# Patient Record
Sex: Male | Born: 1980 | Race: Black or African American | Hispanic: No | Marital: Single | Smoking: Current every day smoker
Health system: Southern US, Community
[De-identification: ages and names within clinical notes are randomized; demographics above are authoritative.]

## PROBLEM LIST (undated history)

## (undated) DIAGNOSIS — F209 Schizophrenia, unspecified: Secondary | ICD-10-CM

## (undated) DIAGNOSIS — F32A Depression, unspecified: Secondary | ICD-10-CM

## (undated) DIAGNOSIS — F329 Major depressive disorder, single episode, unspecified: Secondary | ICD-10-CM

## (undated) DIAGNOSIS — F319 Bipolar disorder, unspecified: Secondary | ICD-10-CM

## (undated) DIAGNOSIS — I10 Essential (primary) hypertension: Secondary | ICD-10-CM

---
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
## 2006-04-30 ENCOUNTER — Emergency Department (HOSPITAL_COMMUNITY): Admission: EM | Admit: 2006-04-30 | Discharge: 2006-05-01 | Payer: Self-pay | Admitting: Emergency Medicine

## 2006-05-06 ENCOUNTER — Emergency Department (HOSPITAL_COMMUNITY): Admission: EM | Admit: 2006-05-06 | Discharge: 2006-05-06 | Payer: Self-pay | Admitting: *Deleted

## 2006-07-26 ENCOUNTER — Emergency Department (HOSPITAL_COMMUNITY): Admission: EM | Admit: 2006-07-26 | Discharge: 2006-07-26 | Payer: Self-pay | Admitting: Emergency Medicine

## 2006-07-29 ENCOUNTER — Emergency Department (HOSPITAL_COMMUNITY): Admission: EM | Admit: 2006-07-29 | Discharge: 2006-07-29 | Payer: Self-pay | Admitting: Emergency Medicine

## 2006-12-12 ENCOUNTER — Emergency Department (HOSPITAL_COMMUNITY): Admission: EM | Admit: 2006-12-12 | Discharge: 2006-12-12 | Payer: Self-pay | Admitting: Emergency Medicine

## 2009-12-02 ENCOUNTER — Emergency Department (HOSPITAL_COMMUNITY): Admission: EM | Admit: 2009-12-02 | Discharge: 2009-12-02 | Payer: Self-pay | Admitting: Emergency Medicine
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
## 2010-11-23 LAB — GC/CHLAMYDIA PROBE AMP, GENITAL
Chlamydia, DNA Probe: NEGATIVE
GC Probe Amp, Genital: NEGATIVE

## 2010-12-01 LAB — URINE CULTURE
Colony Count: NO GROWTH
Culture: NO GROWTH

## 2010-12-01 LAB — URINE MICROSCOPIC-ADD ON

## 2010-12-01 LAB — URINALYSIS, ROUTINE W REFLEX MICROSCOPIC
Bilirubin Urine: NEGATIVE
Glucose, UA: NEGATIVE
Ketones, ur: 15 — AB
pH: 6.5

## 2016-06-04 ENCOUNTER — Emergency Department (HOSPITAL_COMMUNITY)
Admission: EM | Admit: 2016-06-04 | Discharge: 2016-06-04 | Disposition: A | Discharge status: Home / Self Care | Payer: Self-pay | Attending: Emergency Medicine | Admitting: Emergency Medicine

## 2016-06-04 ENCOUNTER — Encounter (HOSPITAL_COMMUNITY): Payer: Self-pay | Admitting: Emergency Medicine

## 2016-06-04 DIAGNOSIS — Z113 Encounter for screening for infections with a predominantly sexual mode of transmission: Secondary | ICD-10-CM | POA: Insufficient documentation

## 2016-06-04 DIAGNOSIS — Z711 Person with feared health complaint in whom no diagnosis is made: Secondary | ICD-10-CM

## 2016-06-04 DIAGNOSIS — F17200 Nicotine dependence, unspecified, uncomplicated: Secondary | ICD-10-CM | POA: Insufficient documentation

## 2016-06-04 DIAGNOSIS — G8921 Chronic pain due to trauma: Secondary | ICD-10-CM | POA: Insufficient documentation

## 2016-06-04 DIAGNOSIS — L03012 Cellulitis of left finger: Secondary | ICD-10-CM | POA: Insufficient documentation

## 2016-06-04 MED ORDER — CEPHALEXIN 250 MG PO CAPS
500.0000 mg | ORAL_CAPSULE | Freq: Once | ORAL | Status: AC
  Administered 2016-06-04: 500 mg via ORAL
  Filled 2016-06-04: qty 2

## 2016-06-04 MED ORDER — LIDOCAINE HCL (PF) 1 % IJ SOLN
5.0000 mL | Freq: Once | INTRAMUSCULAR | Status: AC
  Administered 2016-06-04: 5 mL
  Filled 2016-06-04: qty 5

## 2016-06-04 MED ORDER — CEPHALEXIN 500 MG PO CAPS: 500.0000 mg | ORAL_CAPSULE | Freq: Four times a day (QID) | ORAL | 0 refills | Status: DC

## 2016-06-04 MED ORDER — HYDROCODONE-ACETAMINOPHEN 5-325 MG PO TABS
1.0000 | ORAL_TABLET | Freq: Once | ORAL | Status: AC
  Administered 2016-06-04: 1 via ORAL
  Filled 2016-06-04: qty 1

## 2016-06-04 MED ORDER — SULFAMETHOXAZOLE-TRIMETHOPRIM 800-160 MG PO TABS
1.0000 | ORAL_TABLET | Freq: Once | ORAL | Status: AC
  Administered 2016-06-04: 1 via ORAL
  Filled 2016-06-04: qty 1

## 2016-06-04 MED ORDER — SULFAMETHOXAZOLE-TRIMETHOPRIM 800-160 MG PO TABS: 1.0000 | ORAL_TABLET | Freq: Two times a day (BID) | ORAL | 0 refills | Status: AC

## 2016-06-04 MED ORDER — DICLOFENAC SODIUM 50 MG PO TBEC: 50.0000 mg | DELAYED_RELEASE_TABLET | Freq: Two times a day (BID) | ORAL | 0 refills | Status: DC

## 2016-06-04 NOTE — Discharge Instructions (Signed)
Call [facility] for follow up. Soak you finger in warm walt water 3 times a day for 15 minutes. Take the medications as directed.  We will call you if your cultures come back positive for your possible STD exposure.
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
## 2016-06-05 LAB — GC/CHLAMYDIA PROBE AMP (~~LOC~~) NOT AT ARMC
Chlamydia: NEGATIVE
Neisseria Gonorrhea: NEGATIVE

## 2016-06-11 ENCOUNTER — Emergency Department (HOSPITAL_COMMUNITY): Payer: Self-pay

## 2016-06-11 ENCOUNTER — Emergency Department (HOSPITAL_COMMUNITY): Payer: Self-pay | Admitting: Anesthesiology

## 2016-06-11 ENCOUNTER — Encounter (HOSPITAL_COMMUNITY): Payer: Self-pay | Admitting: Emergency Medicine

## 2016-06-11 ENCOUNTER — Ambulatory Visit (HOSPITAL_COMMUNITY)
Admission: EM | Admit: 2016-06-11 | Discharge: 2016-06-12 | Disposition: A | Discharge status: Home / Self Care | Payer: Self-pay | Attending: Orthopedic Surgery | Admitting: Orthopedic Surgery

## 2016-06-11 ENCOUNTER — Encounter (HOSPITAL_COMMUNITY)
Admission: EM | Disposition: A | Discharge status: Home / Self Care | Payer: Self-pay | Source: Home / Self Care | Attending: Emergency Medicine

## 2016-06-11 DIAGNOSIS — F1410 Cocaine abuse, uncomplicated: Secondary | ICD-10-CM | POA: Insufficient documentation

## 2016-06-11 DIAGNOSIS — F1210 Cannabis abuse, uncomplicated: Secondary | ICD-10-CM | POA: Insufficient documentation

## 2016-06-11 DIAGNOSIS — T148XXA Other injury of unspecified body region, initial encounter: Secondary | ICD-10-CM

## 2016-06-11 DIAGNOSIS — S66822A Laceration of other specified muscles, fascia and tendons at wrist and hand level, left hand, initial encounter: Secondary | ICD-10-CM | POA: Insufficient documentation

## 2016-06-11 DIAGNOSIS — S66323A Laceration of extensor muscle, fascia and tendon of left middle finger at wrist and hand level, initial encounter: Secondary | ICD-10-CM | POA: Insufficient documentation

## 2016-06-11 DIAGNOSIS — Y929 Unspecified place or not applicable: Secondary | ICD-10-CM | POA: Insufficient documentation

## 2016-06-11 DIAGNOSIS — S66327A Laceration of extensor muscle, fascia and tendon of left little finger at wrist and hand level, initial encounter: Secondary | ICD-10-CM | POA: Insufficient documentation

## 2016-06-11 DIAGNOSIS — M542 Cervicalgia: Secondary | ICD-10-CM

## 2016-06-11 DIAGNOSIS — S0990XA Unspecified injury of head, initial encounter: Secondary | ICD-10-CM

## 2016-06-11 DIAGNOSIS — S52502B Unspecified fracture of the lower end of left radius, initial encounter for open fracture type I or II: Secondary | ICD-10-CM | POA: Diagnosis present

## 2016-06-11 DIAGNOSIS — S52572B Other intraarticular fracture of lower end of left radius, initial encounter for open fracture type I or II: Secondary | ICD-10-CM | POA: Insufficient documentation

## 2016-06-11 DIAGNOSIS — S66321A Laceration of extensor muscle, fascia and tendon of left index finger at wrist and hand level, initial encounter: Secondary | ICD-10-CM | POA: Insufficient documentation

## 2016-06-11 DIAGNOSIS — F17200 Nicotine dependence, unspecified, uncomplicated: Secondary | ICD-10-CM | POA: Insufficient documentation

## 2016-06-11 DIAGNOSIS — S66325A Laceration of extensor muscle, fascia and tendon of left ring finger at wrist and hand level, initial encounter: Secondary | ICD-10-CM | POA: Insufficient documentation

## 2016-06-11 DIAGNOSIS — Y939 Activity, unspecified: Secondary | ICD-10-CM | POA: Insufficient documentation

## 2016-06-11 DIAGNOSIS — T07XXXA Unspecified multiple injuries, initial encounter: Secondary | ICD-10-CM

## 2016-06-11 DIAGNOSIS — S0219XA Other fracture of base of skull, initial encounter for closed fracture: Secondary | ICD-10-CM

## 2016-06-11 HISTORY — PX: OPEN REDUCTION INTERNAL FIXATION (ORIF) DISTAL RADIAL FRACTURE: SHX5989

## 2016-06-11 LAB — I-STAT CHEM 8, ED
BUN: 16 mg/dL (ref 6–20)
CHLORIDE: 111 mmol/L (ref 101–111)
CREATININE: 1.5 mg/dL — AB (ref 0.61–1.24)
Calcium, Ion: 1 mmol/L — ABNORMAL LOW (ref 1.15–1.40)
GLUCOSE: 86 mg/dL (ref 65–99)
HCT: 43 % (ref 39.0–52.0)
Hemoglobin: 14.6 g/dL (ref 13.0–17.0)
POTASSIUM: 3.6 mmol/L (ref 3.5–5.1)
Sodium: 144 mmol/L (ref 135–145)
TCO2: 22 mmol/L (ref 0–100)

## 2016-06-11 LAB — COMPREHENSIVE METABOLIC PANEL
ALK PHOS: 59 U/L (ref 38–126)
ALT: 19 U/L (ref 17–63)
AST: 43 U/L — AB (ref 15–41)
Albumin: 4.2 g/dL (ref 3.5–5.0)
Anion gap: 12 (ref 5–15)
BILIRUBIN TOTAL: 0.6 mg/dL (ref 0.3–1.2)
BUN: 11 mg/dL (ref 6–20)
CALCIUM: 9 mg/dL (ref 8.9–10.3)
CO2: 20 mmol/L — ABNORMAL LOW (ref 22–32)
CREATININE: 1.37 mg/dL — AB (ref 0.61–1.24)
Chloride: 111 mmol/L (ref 101–111)
Glucose, Bld: 86 mg/dL (ref 65–99)
Potassium: 3.4 mmol/L — ABNORMAL LOW (ref 3.5–5.1)
Sodium: 143 mmol/L (ref 135–145)
Total Protein: 7.9 g/dL (ref 6.5–8.1)

## 2016-06-11 LAB — URINALYSIS, ROUTINE W REFLEX MICROSCOPIC
Bilirubin Urine: NEGATIVE
GLUCOSE, UA: NEGATIVE mg/dL
Ketones, ur: NEGATIVE mg/dL
LEUKOCYTES UA: NEGATIVE
NITRITE: NEGATIVE
PROTEIN: 30 mg/dL — AB
SQUAMOUS EPITHELIAL / LPF: NONE SEEN
Specific Gravity, Urine: 1.024 (ref 1.005–1.030)
pH: 5 (ref 5.0–8.0)

## 2016-06-11 LAB — CBC
HCT: 40.7 % (ref 39.0–52.0)
Hemoglobin: 13 g/dL (ref 13.0–17.0)
MCH: 24 pg — AB (ref 26.0–34.0)
MCHC: 31.9 g/dL (ref 30.0–36.0)
MCV: 75.1 fL — ABNORMAL LOW (ref 78.0–100.0)
PLATELETS: 287 10*3/uL (ref 150–400)
RBC: 5.42 MIL/uL (ref 4.22–5.81)
RDW: 16.1 % — AB (ref 11.5–15.5)
WBC: 15.8 10*3/uL — AB (ref 4.0–10.5)

## 2016-06-11 LAB — I-STAT TROPONIN, ED: Troponin i, poc: 0.01 ng/mL (ref 0.00–0.08)

## 2016-06-11 LAB — RAPID URINE DRUG SCREEN, HOSP PERFORMED
AMPHETAMINES: NOT DETECTED
BENZODIAZEPINES: NOT DETECTED
Barbiturates: NOT DETECTED
Cocaine: POSITIVE — AB
Opiates: NOT DETECTED
Tetrahydrocannabinol: NOT DETECTED

## 2016-06-11 LAB — ETHANOL: ALCOHOL ETHYL (B): 60 mg/dL — AB (ref <5)

## 2016-06-11 SURGERY — OPEN REDUCTION INTERNAL FIXATION (ORIF) DISTAL RADIUS FRACTURE
Anesthesia: Monitor Anesthesia Care | Site: Arm Lower | Laterality: Left

## 2016-06-11 MED ORDER — OXYCODONE-ACETAMINOPHEN 5-325 MG PO TABS: ORAL_TABLET | ORAL | 0 refills | Status: DC

## 2016-06-11 MED ORDER — METHOCARBAMOL 500 MG PO TABS
500.0000 mg | ORAL_TABLET | Freq: Four times a day (QID) | ORAL | Status: DC | PRN
  Administered 2016-06-11 – 2016-06-12 (×3): 500 mg via ORAL
  Filled 2016-06-11 (×3): qty 1

## 2016-06-11 MED ORDER — MIDAZOLAM HCL 5 MG/5ML IJ SOLN
INTRAMUSCULAR | Status: DC | PRN
  Administered 2016-06-11: 2 mg via INTRAVENOUS

## 2016-06-11 MED ORDER — LACTATED RINGERS IV SOLN
INTRAVENOUS | Status: DC
  Administered 2016-06-11 – 2016-06-12 (×2): via INTRAVENOUS

## 2016-06-11 MED ORDER — PROPOFOL 1000 MG/100ML IV EMUL
INTRAVENOUS | Status: AC
  Filled 2016-06-11: qty 100

## 2016-06-11 MED ORDER — BUPIVACAINE-EPINEPHRINE (PF) 0.5% -1:200000 IJ SOLN
INTRAMUSCULAR | Status: DC | PRN
  Administered 2016-06-11: 30 mL via PERINEURAL

## 2016-06-11 MED ORDER — CEFAZOLIN SODIUM-DEXTROSE 1-4 GM/50ML-% IV SOLN
1.0000 g | Freq: Three times a day (TID) | INTRAVENOUS | Status: DC
  Filled 2016-06-11 (×2): qty 50

## 2016-06-11 MED ORDER — DEXMEDETOMIDINE HCL IN NACL 200 MCG/50ML IV SOLN
INTRAVENOUS | Status: AC
  Filled 2016-06-11: qty 50

## 2016-06-11 MED ORDER — ONDANSETRON HCL 4 MG PO TABS: 4.0000 mg | ORAL_TABLET | Freq: Four times a day (QID) | ORAL | Status: DC | PRN

## 2016-06-11 MED ORDER — CEFAZOLIN SODIUM-DEXTROSE 1-4 GM/50ML-% IV SOLN
1.0000 g | INTRAVENOUS | Status: DC
Start: 2016-06-11 — End: 2016-06-11
  Filled 2016-06-11: qty 50

## 2016-06-11 MED ORDER — ENOXAPARIN SODIUM 40 MG/0.4ML ~~LOC~~ SOLN
40.0000 mg | SUBCUTANEOUS | Status: DC
  Filled 2016-06-11: qty 0.4

## 2016-06-11 MED ORDER — FAMOTIDINE 20 MG PO TABS: 20.0000 mg | ORAL_TABLET | Freq: Two times a day (BID) | ORAL | Status: DC | PRN

## 2016-06-11 MED ORDER — TEMAZEPAM 15 MG PO CAPS
15.0000 mg | ORAL_CAPSULE | Freq: Every evening | ORAL | Status: DC | PRN
  Administered 2016-06-12: 15 mg via ORAL
  Filled 2016-06-11: qty 1

## 2016-06-11 MED ORDER — OXYCODONE HCL 5 MG/5ML PO SOLN: 5.0000 mg | Freq: Once | ORAL | Status: DC | PRN

## 2016-06-11 MED ORDER — OXYCODONE HCL 5 MG PO TABS: 5.0000 mg | ORAL_TABLET | Freq: Once | ORAL | Status: DC | PRN

## 2016-06-11 MED ORDER — MORPHINE SULFATE (PF) 4 MG/ML IV SOLN
1.0000 mg | INTRAVENOUS | Status: DC | PRN
  Administered 2016-06-12 (×2): 1 mg via INTRAVENOUS
  Filled 2016-06-11 (×2): qty 1

## 2016-06-11 MED ORDER — DIPHENHYDRAMINE HCL 25 MG PO CAPS: 25.0000 mg | ORAL_CAPSULE | Freq: Four times a day (QID) | ORAL | Status: DC | PRN

## 2016-06-11 MED ORDER — DEXMEDETOMIDINE HCL 200 MCG/2ML IV SOLN
INTRAVENOUS | Status: DC | PRN
  Administered 2016-06-11: 4 ug via INTRAVENOUS
  Administered 2016-06-11: 8 ug via INTRAVENOUS
  Administered 2016-06-11 (×2): 4 ug via INTRAVENOUS
  Administered 2016-06-11: 8 ug via INTRAVENOUS
  Administered 2016-06-11: 4 ug via INTRAVENOUS
  Administered 2016-06-11: 8 ug via INTRAVENOUS
  Administered 2016-06-11 (×2): 4 ug via INTRAVENOUS

## 2016-06-11 MED ORDER — METHOCARBAMOL 1000 MG/10ML IJ SOLN
500.0000 mg | Freq: Four times a day (QID) | INTRAVENOUS | Status: DC | PRN
  Filled 2016-06-11: qty 5

## 2016-06-11 MED ORDER — FENTANYL CITRATE (PF) 100 MCG/2ML IJ SOLN: 25.0000 ug | INTRAMUSCULAR | Status: DC | PRN

## 2016-06-11 MED ORDER — LIDOCAINE-EPINEPHRINE (PF) 1.5 %-1:200000 IJ SOLN
INTRAMUSCULAR | Status: DC | PRN
  Administered 2016-06-11: 10 mL via PERINEURAL

## 2016-06-11 MED ORDER — OXYCODONE-ACETAMINOPHEN 5-325 MG PO TABS
1.0000 | ORAL_TABLET | ORAL | Status: DC | PRN
  Administered 2016-06-11 – 2016-06-12 (×4): 2 via ORAL
  Filled 2016-06-11 (×4): qty 2

## 2016-06-11 MED ORDER — FENTANYL CITRATE (PF) 250 MCG/5ML IJ SOLN
INTRAMUSCULAR | Status: AC
  Filled 2016-06-11: qty 5

## 2016-06-11 MED ORDER — FENTANYL CITRATE (PF) 250 MCG/5ML IJ SOLN
INTRAMUSCULAR | Status: DC | PRN
  Administered 2016-06-11 (×2): 50 ug via INTRAVENOUS

## 2016-06-11 MED ORDER — LIDOCAINE HCL 1 % IJ SOLN
30.0000 mL | Freq: Once | INTRAMUSCULAR | Status: AC
  Administered 2016-06-11: 10 mL
  Filled 2016-06-11: qty 40

## 2016-06-11 MED ORDER — LACTATED RINGERS IV SOLN
INTRAVENOUS | Status: DC | PRN
  Administered 2016-06-11 (×3): via INTRAVENOUS

## 2016-06-11 MED ORDER — MIDAZOLAM HCL 2 MG/2ML IJ SOLN
INTRAMUSCULAR | Status: AC
  Filled 2016-06-11: qty 2

## 2016-06-11 MED ORDER — SULFAMETHOXAZOLE-TRIMETHOPRIM 800-160 MG PO TABS: 1.0000 | ORAL_TABLET | Freq: Two times a day (BID) | ORAL | 0 refills | Status: DC

## 2016-06-11 MED ORDER — PROPOFOL 500 MG/50ML IV EMUL
INTRAVENOUS | Status: DC | PRN
  Administered 2016-06-11: 75 ug/kg/min via INTRAVENOUS

## 2016-06-11 MED ORDER — ONDANSETRON HCL 4 MG/2ML IJ SOLN: 4.0000 mg | Freq: Four times a day (QID) | INTRAMUSCULAR | Status: DC | PRN

## 2016-06-11 MED ORDER — PROPOFOL 10 MG/ML IV BOLUS
INTRAVENOUS | Status: AC
  Filled 2016-06-11: qty 20

## 2016-06-11 MED ORDER — BUPIVACAINE HCL (PF) 0.25 % IJ SOLN
INTRAMUSCULAR | Status: AC
  Filled 2016-06-11: qty 30

## 2016-06-11 MED ORDER — VITAMIN C 500 MG PO TABS
1000.0000 mg | ORAL_TABLET | Freq: Every day | ORAL | Status: DC
  Administered 2016-06-11 – 2016-06-12 (×2): 1000 mg via ORAL
  Filled 2016-06-11 (×2): qty 2

## 2016-06-11 MED ORDER — CEFAZOLIN SODIUM-DEXTROSE 2-4 GM/100ML-% IV SOLN
INTRAVENOUS | Status: AC
  Filled 2016-06-11: qty 100

## 2016-06-11 MED ORDER — 0.9 % SODIUM CHLORIDE (POUR BTL) OPTIME
TOPICAL | Status: DC | PRN
  Administered 2016-06-11: 1000 mL

## 2016-06-11 MED ORDER — CEFAZOLIN SODIUM-DEXTROSE 2-4 GM/100ML-% IV SOLN
2.0000 g | Freq: Three times a day (TID) | INTRAVENOUS | Status: DC
  Administered 2016-06-11 – 2016-06-12 (×4): 2 g via INTRAVENOUS
  Filled 2016-06-11 (×5): qty 100

## 2016-06-11 MED ORDER — TETANUS-DIPHTH-ACELL PERTUSSIS 5-2.5-18.5 LF-MCG/0.5 IM SUSP
0.5000 mL | Freq: Once | INTRAMUSCULAR | Status: AC
  Administered 2016-06-11: 0.5 mL via INTRAMUSCULAR
  Filled 2016-06-11: qty 0.5

## 2016-06-11 SURGICAL SUPPLY — 73 items
BANDAGE ACE 3X5.8 VEL STRL LF (GAUZE/BANDAGES/DRESSINGS) ×2 IMPLANT
BANDAGE ACE 4X5 VEL STRL LF (GAUZE/BANDAGES/DRESSINGS) ×2 IMPLANT
BANDAGE ELASTIC 3 VELCRO ST LF (GAUZE/BANDAGES/DRESSINGS) ×2 IMPLANT
BANDAGE ELASTIC 4 VELCRO ST LF (GAUZE/BANDAGES/DRESSINGS) ×2 IMPLANT
BIT DRILL 2.0 LNG QUCK RELEASE (BIT) ×1 IMPLANT
BIT DRILL 2.8X5 QR DISP (BIT) ×2 IMPLANT
BLADE CLIPPER SURG (BLADE) IMPLANT
BNDG ESMARK 4X9 LF (GAUZE/BANDAGES/DRESSINGS) ×2 IMPLANT
BNDG GAUZE ELAST 4 BULKY (GAUZE/BANDAGES/DRESSINGS) ×2 IMPLANT
BNDG PLASTER X FAST 5X5 WHT LF (CAST SUPPLIES) ×2 IMPLANT
CORDS BIPOLAR (ELECTRODE) ×2 IMPLANT
COVER SURGICAL LIGHT HANDLE (MISCELLANEOUS) ×2 IMPLANT
CUFF TOURNIQUET SINGLE 18IN (TOURNIQUET CUFF) ×2 IMPLANT
CUFF TOURNIQUET SINGLE 24IN (TOURNIQUET CUFF) IMPLANT
DECANTER SPIKE VIAL GLASS SM (MISCELLANEOUS) IMPLANT
DRAIN TLS ROUND 10FR (DRAIN) IMPLANT
DRAPE OEC MINIVIEW 54X84 (DRAPES) IMPLANT
DRAPE SURG 17X23 STRL (DRAPES) ×2 IMPLANT
DRILL 2.0 LNG QUICK RELEASE (BIT) ×2
GAUZE SPONGE 4X4 12PLY STRL (GAUZE/BANDAGES/DRESSINGS) ×2 IMPLANT
GAUZE SPONGE 4X4 12PLY STRL LF (GAUZE/BANDAGES/DRESSINGS) ×2 IMPLANT
GAUZE XEROFORM 1X8 LF (GAUZE/BANDAGES/DRESSINGS) ×2 IMPLANT
GLOVE BIO SURGEON STRL SZ7.5 (GLOVE) ×2 IMPLANT
GLOVE BIOGEL PI IND STRL 8 (GLOVE) ×1 IMPLANT
GLOVE BIOGEL PI INDICATOR 8 (GLOVE) ×1
GOWN STRL REUS W/ TWL LRG LVL3 (GOWN DISPOSABLE) ×3 IMPLANT
GOWN STRL REUS W/ TWL XL LVL3 (GOWN DISPOSABLE) ×3 IMPLANT
GOWN STRL REUS W/TWL LRG LVL3 (GOWN DISPOSABLE) ×3
GOWN STRL REUS W/TWL XL LVL3 (GOWN DISPOSABLE) ×3
GUIDEWIRE ORTHO 0.054X6 (WIRE) ×6 IMPLANT
KIT BASIN OR (CUSTOM PROCEDURE TRAY) ×2 IMPLANT
KIT ROOM TURNOVER OR (KITS) ×2 IMPLANT
LOOP VESSEL MAXI BLUE (MISCELLANEOUS) IMPLANT
MANIFOLD NEPTUNE II (INSTRUMENTS) ×2 IMPLANT
NEEDLE 22X1 1/2 (OR ONLY) (NEEDLE) IMPLANT
NS IRRIG 1000ML POUR BTL (IV SOLUTION) ×2 IMPLANT
PACK ORTHO EXTREMITY (CUSTOM PROCEDURE TRAY) ×2 IMPLANT
PAD ARMBOARD 7.5X6 YLW CONV (MISCELLANEOUS) ×4 IMPLANT
PAD CAST 4YDX4 CTTN HI CHSV (CAST SUPPLIES) ×1 IMPLANT
PADDING CAST COTTON 4X4 STRL (CAST SUPPLIES) ×1
PADDING CAST COTTON 6X4 STRL (CAST SUPPLIES) ×2 IMPLANT
PLATE ACU LOC PROX STD LEFT (Plate) ×2 IMPLANT
SCREW BN FT 16X2.3XLCK HEX CRT (Screw) ×1 IMPLANT
SCREW CORT FT 18X2.3XLCK HEX (Screw) ×1 IMPLANT
SCREW CORTICAL LOCKING 2.3X16M (Screw) ×2 IMPLANT
SCREW CORTICAL LOCKING 2.3X18M (Screw) ×4 IMPLANT
SCREW FX16X2.3XLCK SMTH NS CRT (Screw) ×1 IMPLANT
SCREW FX18X2.3XSMTH LCK NS CRT (Screw) ×3 IMPLANT
SCREW HEX 3.5X15 NLCKG STRL (Screw) ×2 IMPLANT
SCREW HEX 3.5X15MM (Screw) ×4 IMPLANT
SCREW NLCKG 13 3.5X13 HEXA (Screw) ×1 IMPLANT
SCREW NON TOGG 2.3X20MM (Screw) ×2 IMPLANT
SCREW NON-LOCK 3.5X13 (Screw) ×2 IMPLANT
SCRUB POVIDONE IODINE 4 OZ (MISCELLANEOUS) ×2 IMPLANT
SOL PREP POV-IOD 4OZ 10% (MISCELLANEOUS) ×2 IMPLANT
SPLINT PLASTER EXTRA FAST 3X15 (CAST SUPPLIES) ×1
SPLINT PLASTER GYPS XFAST 3X15 (CAST SUPPLIES) ×1 IMPLANT
SPONGE LAP 4X18 X RAY DECT (DISPOSABLE) IMPLANT
SUT ETHILON 3 0 PS 1 (SUTURE) ×2 IMPLANT
SUT ETHILON 4 0 PS 2 18 (SUTURE) ×4 IMPLANT
SUT FIBERWIRE 3-0 18 TAPR NDL (SUTURE) ×8
SUT MNCRL AB 4-0 PS2 18 (SUTURE) ×2 IMPLANT
SUT PROLENE 3 0 PS 2 (SUTURE) IMPLANT
SUT VIC AB 3-0 FS2 27 (SUTURE) ×2 IMPLANT
SUTURE FIBERWR 3-0 18 TAPR NDL (SUTURE) ×4 IMPLANT
SYR CONTROL 10ML LL (SYRINGE) IMPLANT
SYSTEM CHEST DRAIN TLS 7FR (DRAIN) IMPLANT
TOWEL OR 17X24 6PK STRL BLUE (TOWEL DISPOSABLE) ×2 IMPLANT
TOWEL OR 17X26 10 PK STRL BLUE (TOWEL DISPOSABLE) ×2 IMPLANT
TUBE CONNECTING 12X1/4 (SUCTIONS) ×2 IMPLANT
TUBE EVACUATION TLS (MISCELLANEOUS) ×2 IMPLANT
UNDERPAD 30X30 (UNDERPADS AND DIAPERS) ×2 IMPLANT
WATER STERILE IRR 1000ML POUR (IV SOLUTION) ×2 IMPLANT

## 2016-06-11 NOTE — ED Notes (Signed)
Delay in lab draw,  Xray in room at this time.,

## 2016-06-11 NOTE — Brief Op Note (Signed)
06/11/2016  3:25 PM  PATIENT:  Bruce Benton  36 y.o. male  PRE-OPERATIVE DIAGNOSIS:  LEFT DISTAL RADIUS FRACTURE - OPEN  POST-OPERATIVE DIAGNOSIS:  LEFT DISTAL RADIUS FRACTURE - OPEN, extensor tendon lacerations  PROCEDURE:  Procedure(s): IRRIGATION AND DEBRIDEMENT WITH OPEN REDUCTION INTERNAL FIXATION (ORIF) DISTAL RADIAL FRACTURE AND TENDON REPAIR (Left)  SURGEON:  Surgeon(s) and Role:    * Betha Loa, MD - Primary  PHYSICIAN ASSISTANT:   ASSISTANTS: none   ANESTHESIA:   regional and IV sedation  EBL:  Total I/O In: 1150 [I.V.:1150] Out: 50 [Blood:50]  BLOOD ADMINISTERED:none  DRAINS: none   LOCAL MEDICATIONS USED:  NONE  SPECIMEN:  No Specimen  DISPOSITION OF SPECIMEN:  N/A  COUNTS:  YES  TOURNIQUET:   Total Tourniquet Time Documented: Upper Arm (Left) - 139 minutes Total: Upper Arm (Left) - 139 minutes   DICTATION: .Other Dictation: Dictation Number [id number]  PLAN OF CARE: outpatient with extended recovery  PATIENT DISPOSITION:  PACU - hemodynamically stable.   Delay start of Pharmacological VTE agent (>24hrs) due to surgical blood loss or risk of bleeding: no
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
## 2016-06-11 NOTE — Anesthesia Postprocedure Evaluation (Addendum)
Anesthesia Post Note  Patient: Bruce Benton  Procedure(s) Performed: Procedure(s) (LRB): IRRIGATION AND DEBRIDEMENT WITH OPEN REDUCTION INTERNAL FIXATION (ORIF) DISTAL RADIAL FRACTURE AND TENDON REPAIR (Left)  Patient location during evaluation: PACU Anesthesia Type: Regional Level of consciousness: awake and alert Pain management: pain level controlled Vital Signs Assessment: post-procedure vital signs reviewed and stable Respiratory status: spontaneous breathing, nonlabored ventilation, respiratory function stable and patient connected to nasal cannula oxygen Cardiovascular status: stable and blood pressure returned to baseline Anesthetic complications: no       Last Vitals:  Vitals:   06/11/16 1634 06/11/16 1658  BP: 104/72 108/65  Pulse: 73 74  Resp: 15 16  Temp:  36.5 C    Last Pain:  Vitals:   06/11/16 1658  TempSrc: Axillary  PainSc:                  Marilynn Ekstein

## 2016-06-11 NOTE — Transfer of Care (Signed)
Immediate Anesthesia Transfer of Care Note  Patient: Bruce Benton  Procedure(s) Performed: Procedure(s): IRRIGATION AND DEBRIDEMENT WITH OPEN REDUCTION INTERNAL FIXATION (ORIF) DISTAL RADIAL FRACTURE AND TENDON REPAIR (Left)  Patient Location: PACU  Anesthesia Type:MAC  Level of Consciousness: sedated  Airway & Oxygen Therapy: Patient Spontanous Breathing and Patient connected to nasal cannula oxygen  Post-op Assessment: Report given to RN, Post -op Vital signs reviewed and stable and Patient moving all extremities  Post vital signs: Reviewed and stable  Last Vitals:  Vitals:   06/11/16 1030 06/11/16 1100  BP: 133/85 131/86  Pulse: 99 96  Resp: 14 14    Last Pain:  Vitals:   06/11/16 0603  PainSc: Asleep         Complications: No apparent anesthesia complications

## 2016-06-11 NOTE — Discharge Instructions (Signed)

## 2016-06-11 NOTE — ED Notes (Signed)
[phone] Misty Stanley (friend)

## 2016-06-11 NOTE — ED Notes (Signed)
Delay in lab draw,  Pt enroute to CT. 

## 2016-06-11 NOTE — Anesthesia Procedure Notes (Signed)
Anesthesia Regional Block: Axillary brachial plexus block   Pre-Anesthetic Checklist: ,, timeout performed, Correct Patient, Correct Site, Correct Laterality, Correct Procedure, Correct Position, site marked, Risks and benefits discussed,  Surgical consent,  Pre-op evaluation,  At surgeon's request and post-op pain management  Laterality: Upper and Left  Prep: chloraprep       Needles:  Injection technique: Single-shot  Needle Type: Echogenic Needle          Additional Needles:   Procedures: ultrasound guided,,,,,,,,  Narrative:  Start time: 06/11/2016 12:01 PM End time: 06/11/2016 12:14 PM Injection made incrementally with aspirations every 5 mL.  Performed by: Personally   Additional Notes: H+P and labs reviewed, risks and benefits discussed with patient, procedure tolerated well without complications

## 2016-06-11 NOTE — Anesthesia Preprocedure Evaluation (Signed)
Anesthesia Evaluation  Patient identified by MRN, date of birth, ID band Patient awake    Reviewed: Allergy & Precautions, NPO status , Patient's Chart, lab work & pertinent test results  Airway Mallampati: IV  TM Distance: >3 FB Neck ROM: Limited  Mouth opening: Limited Mouth Opening Comment: c collar, c spine yet to be cleared Dental   Pulmonary Current Smoker,    breath sounds clear to auscultation       Cardiovascular negative cardio ROS   Rhythm:Regular     Neuro/Psych negative neurological ROS  negative psych ROS   GI/Hepatic negative GI ROS, (+)     substance abuse  alcohol use and cocaine use,   Endo/Other  negative endocrine ROS  Renal/GU negative Renal ROS  negative genitourinary   Musculoskeletal negative musculoskeletal ROS (+)   Abdominal   Peds negative pediatric ROS (+)  Hematology negative hematology ROS (+)   Anesthesia Other Findings   Reproductive/Obstetrics negative OB ROS                             Anesthesia Physical Anesthesia Plan  ASA: II  Anesthesia Plan: MAC and Regional   Post-op Pain Management:    Induction: Intravenous  Airway Management Planned: Nasal Cannula, Natural Airway and Simple Face Mask  Additional Equipment: None  Intra-op Plan:   Post-operative Plan:   Informed Consent: I have reviewed the patients History and Physical, chart, labs and discussed the procedure including the risks, benefits and alternatives for the proposed anesthesia with the patient or authorized representative who has indicated his/her understanding and acceptance.   Dental advisory given  Plan Discussed with: CRNA and Surgeon  Anesthesia Plan Comments:         Anesthesia Quick Evaluation
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
## 2016-06-11 NOTE — Op Note (Signed)
Intra-operative fluoroscopic images in the AP, lateral, and oblique views were taken and evaluated by myself.  Reduction and hardware placement were confirmed.  There was no intraarticular penetration of permanent hardware.  

## 2016-06-11 NOTE — ED Provider Notes (Signed)
Pt seen and evaluated by trauma surgeon.  Pt seen by Hand Surgeon and will have wound repaired in OR.     Lonia Skinner [place], PA-C 06/11/16 1218    Lorre Nick, MD 06/14/16 [phone]

## 2016-06-11 NOTE — ED Notes (Addendum)
Dr Merlyn Lot at bedside. Pt to go to OR.

## 2016-06-11 NOTE — ED Notes (Signed)
Cleaned the dried blood around the pt's nose and mouth, per the pt's request.
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
## 2016-06-11 NOTE — Op Note (Signed)
[id number] 

## 2016-06-11 NOTE — ED Triage Notes (Signed)
REport to pre-op staff . Neck x-ray not done prior to transport to pre-op because aspen collar not cleared for removal. Charge Nurse aware x-ray not done.
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
## 2016-06-11 NOTE — ED Notes (Signed)
Incorrect cartridge ran...will credit acct for istat troponin.

## 2016-06-12 ENCOUNTER — Encounter (HOSPITAL_COMMUNITY): Payer: Self-pay | Admitting: Orthopedic Surgery

## 2016-06-12 NOTE — Progress Notes (Signed)
1 Day Post-Op  Subjective: Admitted post-op by Dr. Merlyn Lot CC L wrist pain and mild HA  Objective: Vital signs in last 24 hours: Temp:  [97.7 F (36.5 C)-99.3 F (37.4 C)] 97.9 F (36.6 C) (04/30 0618) Pulse Rate:  [67-100] 96 (04/30 0618) Resp:  [11-23] 18 (04/30 0618) BP: (92-143)/(53-97) 143/95 (04/30 0618) SpO2:  [96 %-100 %] 96 % (04/30 0618) Last BM Date: 06/11/16  Intake/Output from previous day: 04/29 0701 - 04/30 0700 In: 2253 [P.O.:600; I.V.:1453; IV Piggyback:200] Out: 50 [Blood:50] Intake/Output this shift: No intake/output data recorded.  Gen: no distress Neuro: A&O. F/C Lungs: CTA CV: reg Abd: soft NT, ND, old scratches Ext: L wrist splint Neck: no posterior midline tenderness, no pain on AROM Lab Results: CBC   Recent Labs  06/11/16 0448 06/11/16 0512  WBC 15.8*  --   HGB 13.0 14.6  HCT 40.7 43.0  PLT 287  --    BMET  Recent Labs  06/11/16 0448 06/11/16 0512  NA 143 144  K 3.4* 3.6  CL 111 111  CO2 20*  --   GLUCOSE 86 86  BUN 11 16  CREATININE 1.37* 1.50*  CALCIUM 9.0  --    Anti-infectives: Anti-infectives    Start     Dose/Rate Route Frequency Ordered Stop   06/11/16 2030  ceFAZolin (ANCEF) IVPB 1 g/50 mL premix  Status:  Discontinued     1 g 100 mL/hr over 30 Minutes Intravenous Every 8 hours 06/11/16 1703 06/11/16 2042   06/11/16 1600  ceFAZolin (ANCEF) IVPB 1 g/50 mL premix  Status:  Discontinued     1 g 100 mL/hr over 30 Minutes Intravenous NOW 06/11/16 1546 06/11/16 2042   06/11/16 0645  ceFAZolin (ANCEF) IVPB 2g/100 mL premix     2 g 200 mL/hr over 30 Minutes Intravenous Every 8 hours 06/11/16 0631     06/11/16 0000  sulfamethoxazole-trimethoprim (BACTRIM DS) 800-160 MG tablet     1 tablet Oral 2 times daily 06/11/16 1536        Assessment/Plan: C spine cleared, trauma will sign off  LOS: 0 days    Violeta Gelinas, MD, MPH, FACS Trauma: [phone] General Surgery: [phone]  4/30/2018Patient ID: Bruce Benton, male   DOB: 03-07-1980, 36 y.o.   MRN: [id number]
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
## 2016-07-17 ENCOUNTER — Encounter (HOSPITAL_COMMUNITY): Payer: Self-pay | Admitting: Orthopedic Surgery

## 2016-07-17 NOTE — Addendum Note (Signed)
Addendum  created 07/17/16 1431 by Val EagleMoser, Ashby Leflore, MD   Sign clinical note

## 2016-08-01 ENCOUNTER — Emergency Department (HOSPITAL_COMMUNITY)
Admission: EM | Admit: 2016-08-01 | Discharge: 2016-08-02 | Disposition: A | Discharge status: Home / Self Care | Payer: Self-pay | Attending: Emergency Medicine | Admitting: Emergency Medicine

## 2016-08-01 ENCOUNTER — Emergency Department (HOSPITAL_COMMUNITY): Payer: Self-pay

## 2016-08-01 ENCOUNTER — Encounter (HOSPITAL_COMMUNITY): Payer: Self-pay | Admitting: Emergency Medicine

## 2016-08-01 DIAGNOSIS — S01511A Laceration without foreign body of lip, initial encounter: Secondary | ICD-10-CM | POA: Insufficient documentation

## 2016-08-01 DIAGNOSIS — Y939 Activity, unspecified: Secondary | ICD-10-CM | POA: Insufficient documentation

## 2016-08-01 DIAGNOSIS — F17200 Nicotine dependence, unspecified, uncomplicated: Secondary | ICD-10-CM | POA: Insufficient documentation

## 2016-08-01 DIAGNOSIS — Y999 Unspecified external cause status: Secondary | ICD-10-CM | POA: Insufficient documentation

## 2016-08-01 DIAGNOSIS — Y929 Unspecified place or not applicable: Secondary | ICD-10-CM | POA: Insufficient documentation

## 2016-08-01 DIAGNOSIS — S0993XA Unspecified injury of face, initial encounter: Secondary | ICD-10-CM | POA: Insufficient documentation

## 2016-08-01 HISTORY — DX: Major depressive disorder, single episode, unspecified: F32.9

## 2016-08-01 HISTORY — DX: Bipolar disorder, unspecified: F31.9

## 2016-08-01 HISTORY — DX: Depression, unspecified: F32.A

## 2016-08-01 HISTORY — DX: Schizophrenia, unspecified: F20.9

## 2016-08-01 MED ORDER — HYDROCODONE-ACETAMINOPHEN 5-325 MG PO TABS: 1.0000 | ORAL_TABLET | ORAL | 0 refills | Status: DC | PRN

## 2016-08-01 MED ORDER — ONDANSETRON HCL 4 MG/2ML IJ SOLN
4.0000 mg | Freq: Once | INTRAMUSCULAR | Status: AC
  Administered 2016-08-01: 4 mg via INTRAVENOUS
  Filled 2016-08-01: qty 2

## 2016-08-01 MED ORDER — MORPHINE SULFATE (PF) 4 MG/ML IV SOLN
4.0000 mg | INTRAVENOUS | Status: DC | PRN
  Administered 2016-08-01 (×2): 4 mg via INTRAVENOUS
  Filled 2016-08-01 (×2): qty 1

## 2016-08-01 MED ORDER — LIDOCAINE-EPINEPHRINE (PF) 1 %-1:200000 IJ SOLN
10.0000 mL | Freq: Once | INTRAMUSCULAR | Status: AC
  Administered 2016-08-01: 10 mL
  Filled 2016-08-01: qty 10

## 2016-08-01 MED ORDER — SODIUM CHLORIDE 0.9 % IV SOLN
3.0000 g | Freq: Once | INTRAVENOUS | Status: AC
  Administered 2016-08-01: 3 g via INTRAVENOUS
  Filled 2016-08-01: qty 3

## 2016-08-01 MED ORDER — CEPHALEXIN 500 MG PO CAPS: 500.0000 mg | ORAL_CAPSULE | Freq: Four times a day (QID) | ORAL | 0 refills | Status: DC

## 2016-08-01 NOTE — Procedures (Signed)
Preop diagnosis: Upper lip and left oral commissure lacerations Postop diagnosis: same Procedure:  Complex closure of lip lacerations, 9 cm Surgeon: Jenne PaneBates Anesth: 1% lidocaine with 1:100,000 epinephrine Compl: None Description: After discussing risks, benefits, and alternatives, the lips and surrounding skin were prepped with Betadyne and draped.  The lacerations were injected with local anesthetic.  The large, stellate upper lip laceration was closed in layers using 4-0 Vicryl in the muscular layer in a simple, interrupted fashion, pulling the flaps into place.  The skin was closed with 5-0 Prolene and the mucosa of the lip with 5-0 chromic in a simple, interrupted fashion.  The left oral commissure laceration was closed with 5-0 chromic in a simple, interrupted fashion.  After completion, the patient was cleaned off and Bacitracin was applied liberally to the lacerations.  He was returned to nursing care in stable condition.

## 2016-08-01 NOTE — ED Notes (Signed)
ENT at bedside
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
## 2016-08-01 NOTE — Discharge Instructions (Addendum)
Apply antibiotic ointment such as Bacitracin or Neosporin liberally to facial lacerations twice daily.  Call Dr. Maurice MarchLane tomorrow morning for dental appointment to remove or stabilize tooth.

## 2016-08-01 NOTE — Progress Notes (Signed)
Orthopedic Tech Progress Note Patient Details:  Bruce BaileyMyron S Lafont 01/09/1981 [id number]  Ortho Devices Type of Ortho Device: Wrist splint Ortho Device/Splint Location: applied wrist splint to pt left arm.  pt tolerated application very well.   (fiberglass).  Left arm.  Ortho Device/Splint Interventions: Application   Alvina ChouWilliams, Clarann Helvey C 08/01/2016, 10:16 PM

## 2016-08-01 NOTE — ED Notes (Signed)
Pt to CT
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
## 2018-01-11 ENCOUNTER — Encounter (HOSPITAL_COMMUNITY): Payer: Self-pay

## 2018-01-11 ENCOUNTER — Emergency Department (HOSPITAL_COMMUNITY)
Admission: EM | Admit: 2018-01-11 | Discharge: 2018-01-11 | Disposition: A | Discharge status: Home / Self Care | Payer: Self-pay | Attending: Emergency Medicine | Admitting: Emergency Medicine

## 2018-01-11 ENCOUNTER — Other Ambulatory Visit: Payer: Self-pay

## 2018-01-11 DIAGNOSIS — F1290 Cannabis use, unspecified, uncomplicated: Secondary | ICD-10-CM | POA: Insufficient documentation

## 2018-01-11 DIAGNOSIS — F250 Schizoaffective disorder, bipolar type: Secondary | ICD-10-CM | POA: Insufficient documentation

## 2018-01-11 DIAGNOSIS — F1490 Cocaine use, unspecified, uncomplicated: Secondary | ICD-10-CM | POA: Insufficient documentation

## 2018-01-11 DIAGNOSIS — R300 Dysuria: Secondary | ICD-10-CM | POA: Insufficient documentation

## 2018-01-11 DIAGNOSIS — G8929 Other chronic pain: Secondary | ICD-10-CM

## 2018-01-11 DIAGNOSIS — N342 Other urethritis: Secondary | ICD-10-CM

## 2018-01-11 DIAGNOSIS — G8921 Chronic pain due to trauma: Secondary | ICD-10-CM | POA: Insufficient documentation

## 2018-01-11 DIAGNOSIS — F17210 Nicotine dependence, cigarettes, uncomplicated: Secondary | ICD-10-CM | POA: Insufficient documentation

## 2018-01-11 LAB — URINALYSIS, ROUTINE W REFLEX MICROSCOPIC
BILIRUBIN URINE: NEGATIVE
GLUCOSE, UA: NEGATIVE mg/dL
HGB URINE DIPSTICK: NEGATIVE
KETONES UR: NEGATIVE mg/dL
NITRITE: NEGATIVE
PH: 5 (ref 5.0–8.0)
Protein, ur: NEGATIVE mg/dL
SPECIFIC GRAVITY, URINE: 1.025 (ref 1.005–1.030)

## 2018-01-11 MED ORDER — CEFTRIAXONE SODIUM 250 MG IJ SOLR
250.0000 mg | Freq: Once | INTRAMUSCULAR | Status: AC
  Administered 2018-01-11: 250 mg via INTRAMUSCULAR
  Filled 2018-01-11: qty 250

## 2018-01-11 MED ORDER — NAPROXEN 500 MG PO TABS: 500.0000 mg | ORAL_TABLET | Freq: Two times a day (BID) | ORAL | 0 refills | Status: DC

## 2018-01-11 MED ORDER — LIDOCAINE HCL (PF) 1 % IJ SOLN
INTRAMUSCULAR | Status: AC
  Administered 2018-01-11: 0.9 mL
  Filled 2018-01-11: qty 5

## 2018-01-11 MED ORDER — AZITHROMYCIN 250 MG PO TABS
1000.0000 mg | ORAL_TABLET | Freq: Once | ORAL | Status: AC
  Administered 2018-01-11: 1000 mg via ORAL
  Filled 2018-01-11: qty 4
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
## 2018-01-11 NOTE — Discharge Instructions (Addendum)
Please read and follow all provided instructions.  Your diagnoses today include:  1. Urethritis   2. Other chronic pain     Tests performed today include:  Test for gonorrhea and chlamydia.   Test for HIV and syphilis.   You will be notified by telephone with any positive results.   Vital signs. See below for your results today.   Medications:  You were treated with azithromycin and rocephin today. These antibiotics treat you for gonorrhea and chlamydia. They do not treat for HIV or syphilis.   Home care instructions:  Read educational materials contained in this packet and follow any instructions provided.   You should tell your partners about your infection and avoid having sex for one week to allow time for the medicine to work.  Sexually transmitted disease testing also available at:   [facility] [place]  [address], [place], phone [phone] or [phone]    Monday - Friday, call for an appointment  [facility] [place]  [address], [place], phone [phone] or [phone]   Monday - Friday, call for an appointment  Return instructions:   Please return to the Emergency Department if you experience worsening symptoms.   Please return if you have any other emergent concerns.  Additional Information:  Your vital signs today were: BP (!) 132/92 (BP Location: Right Arm)    Pulse 75    Temp 98.7 F (37.1 C) (Oral)    Resp 16    Ht 5\' 4"  (1.626 m)    SpO2 100%    BMI 30.38 kg/m  If your blood pressure (BP) was elevated above 135/85 this visit, please have this repeated by your doctor within one month. --------------
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
## 2018-01-14 LAB — GC/CHLAMYDIA PROBE AMP (~~LOC~~) NOT AT ARMC
Chlamydia: NEGATIVE
Neisseria Gonorrhea: POSITIVE — AB

## 2018-01-21 LAB — RPR: RPR: NONREACTIVE

## 2018-01-21 LAB — HIV ANTIBODY (ROUTINE TESTING W REFLEX): HIV SCREEN 4TH GENERATION: NONREACTIVE

## 2018-05-09 ENCOUNTER — Emergency Department (HOSPITAL_COMMUNITY)
Admission: EM | Admit: 2018-05-09 | Discharge: 2018-05-10 | Disposition: A | Discharge status: Home / Self Care | Payer: Self-pay | Attending: Emergency Medicine | Admitting: Emergency Medicine

## 2018-05-09 ENCOUNTER — Encounter (HOSPITAL_COMMUNITY): Payer: Self-pay | Admitting: Emergency Medicine

## 2018-05-09 ENCOUNTER — Other Ambulatory Visit: Payer: Self-pay

## 2018-05-09 DIAGNOSIS — F1490 Cocaine use, unspecified, uncomplicated: Secondary | ICD-10-CM | POA: Insufficient documentation

## 2018-05-09 DIAGNOSIS — F17210 Nicotine dependence, cigarettes, uncomplicated: Secondary | ICD-10-CM | POA: Insufficient documentation

## 2018-05-09 DIAGNOSIS — R51 Headache: Secondary | ICD-10-CM | POA: Insufficient documentation

## 2018-05-09 DIAGNOSIS — R300 Dysuria: Secondary | ICD-10-CM | POA: Insufficient documentation

## 2018-05-09 DIAGNOSIS — F1290 Cannabis use, unspecified, uncomplicated: Secondary | ICD-10-CM | POA: Insufficient documentation

## 2018-05-09 DIAGNOSIS — R590 Localized enlarged lymph nodes: Secondary | ICD-10-CM | POA: Insufficient documentation

## 2018-05-09 DIAGNOSIS — Z711 Person with feared health complaint in whom no diagnosis is made: Secondary | ICD-10-CM

## 2018-05-09 DIAGNOSIS — F250 Schizoaffective disorder, bipolar type: Secondary | ICD-10-CM | POA: Insufficient documentation

## 2018-05-09 DIAGNOSIS — N342 Other urethritis: Secondary | ICD-10-CM
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
## 2018-05-10 LAB — GC/CHLAMYDIA PROBE AMP (~~LOC~~) NOT AT ARMC
Chlamydia: NEGATIVE
Chlamydia: NEGATIVE
Neisseria Gonorrhea: POSITIVE — AB
Neisseria Gonorrhea: POSITIVE — AB

## 2018-05-10 LAB — RAPID HIV SCREEN (HIV 1/2 AB+AG)
HIV 1/2 Antibodies: NONREACTIVE
HIV-1 P24 Antigen - HIV24: NONREACTIVE

## 2018-05-10 MED ORDER — METRONIDAZOLE 500 MG PO TABS
2000.0000 mg | ORAL_TABLET | Freq: Once | ORAL | Status: AC
  Administered 2018-05-10: 2000 mg via ORAL
  Filled 2018-05-10: qty 4

## 2018-05-10 MED ORDER — CEFTRIAXONE SODIUM 250 MG IJ SOLR
250.0000 mg | Freq: Once | INTRAMUSCULAR | Status: AC
  Administered 2018-05-10: 250 mg via INTRAMUSCULAR
  Filled 2018-05-10: qty 250

## 2018-05-10 MED ORDER — AZITHROMYCIN 250 MG PO TABS
1000.0000 mg | ORAL_TABLET | Freq: Once | ORAL | Status: AC
  Administered 2018-05-10: 1000 mg via ORAL
  Filled 2018-05-10: qty 4
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
## 2018-07-17 ENCOUNTER — Encounter (HOSPITAL_COMMUNITY): Payer: Self-pay | Admitting: Emergency Medicine

## 2018-07-17 ENCOUNTER — Emergency Department (HOSPITAL_COMMUNITY): Payer: Self-pay

## 2018-07-17 ENCOUNTER — Other Ambulatory Visit: Payer: Self-pay

## 2018-07-17 ENCOUNTER — Emergency Department (HOSPITAL_COMMUNITY)
Admission: EM | Admit: 2018-07-17 | Discharge: 2018-07-17 | Disposition: A | Discharge status: Home / Self Care | Payer: Self-pay | Attending: Emergency Medicine | Admitting: Emergency Medicine

## 2018-07-17 DIAGNOSIS — F17220 Nicotine dependence, chewing tobacco, uncomplicated: Secondary | ICD-10-CM | POA: Insufficient documentation

## 2018-07-17 DIAGNOSIS — F17200 Nicotine dependence, unspecified, uncomplicated: Secondary | ICD-10-CM | POA: Insufficient documentation

## 2018-07-17 DIAGNOSIS — F1920 Other psychoactive substance dependence, uncomplicated: Secondary | ICD-10-CM | POA: Insufficient documentation

## 2018-07-17 LAB — RAPID URINE DRUG SCREEN, HOSP PERFORMED
Amphetamines: POSITIVE — AB
Barbiturates: NOT DETECTED
Benzodiazepines: NOT DETECTED
Cocaine: NOT DETECTED
Opiates: NOT DETECTED
Tetrahydrocannabinol: POSITIVE — AB

## 2018-07-17 LAB — CBC WITH DIFFERENTIAL/PLATELET
Abs Immature Granulocytes: 0.01 10*3/uL (ref 0.00–0.07)
Basophils Absolute: 0 10*3/uL (ref 0.0–0.1)
Basophils Relative: 1 %
Eosinophils Absolute: 0 10*3/uL (ref 0.0–0.5)
Eosinophils Relative: 0 %
HCT: 39.3 % (ref 39.0–52.0)
Hemoglobin: 12.8 g/dL — ABNORMAL LOW (ref 13.0–17.0)
Immature Granulocytes: 0 %
Lymphocytes Relative: 18 %
Lymphs Abs: 1 10*3/uL (ref 0.7–4.0)
MCH: 26.3 pg (ref 26.0–34.0)
MCHC: 32.6 g/dL (ref 30.0–36.0)
MCV: 80.9 fL (ref 80.0–100.0)
Monocytes Absolute: 0.4 10*3/uL (ref 0.1–1.0)
Monocytes Relative: 6 %
Neutro Abs: 4.3 10*3/uL (ref 1.7–7.7)
Neutrophils Relative %: 75 %
Platelets: 201 10*3/uL (ref 150–400)
RBC: 4.86 MIL/uL (ref 4.22–5.81)
RDW: 13.4 % (ref 11.5–15.5)
WBC: 5.7 10*3/uL (ref 4.0–10.5)
nRBC: 0 % (ref 0.0–0.2)

## 2018-07-17 LAB — BASIC METABOLIC PANEL
Anion gap: 8 (ref 5–15)
BUN: 16 mg/dL (ref 6–20)
CO2: 25 mmol/L (ref 22–32)
Calcium: 9.2 mg/dL (ref 8.9–10.3)
Chloride: 104 mmol/L (ref 98–111)
Creatinine, Ser: 0.94 mg/dL (ref 0.61–1.24)
GFR calc Af Amer: >60 mL/min (ref >60)
GFR calc non Af Amer: >60 mL/min (ref >60)
Glucose, Bld: 102 mg/dL — ABNORMAL HIGH (ref 70–99)
Potassium: 3.3 mmol/L — ABNORMAL LOW (ref 3.5–5.1)
Sodium: 137 mmol/L (ref 135–145)

## 2018-07-17 LAB — URINALYSIS, COMPLETE (UACMP) WITH MICROSCOPIC
Bacteria, UA: NONE SEEN
Bilirubin Urine: NEGATIVE
Glucose, UA: NEGATIVE mg/dL
Hgb urine dipstick: NEGATIVE
Ketones, ur: 5 mg/dL — AB
Leukocytes,Ua: NEGATIVE
Nitrite: NEGATIVE
Protein, ur: NEGATIVE mg/dL
Specific Gravity, Urine: 1.028 (ref 1.005–1.030)
pH: 6 (ref 5.0–8.0)

## 2018-07-17 LAB — CBG MONITORING, ED: Glucose-Capillary: 102 mg/dL — ABNORMAL HIGH (ref 70–99)

## 2018-07-17 LAB — ETHANOL: Alcohol, Ethyl (B): <10 mg/dL (ref <10)
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
## 2018-07-17 NOTE — ED Notes (Signed)
Patient reports drinking and using drugs daily. Patient reports "I don't remember" when asked the year and place. Refusing to put on gown and wear cardiac monitor.

## 2018-07-17 NOTE — ED Notes (Signed)
Bed: WA01 Expected date:  Expected time:  Means of arrival:  Comments: EMS Seizure

## 2018-07-17 NOTE — ED Notes (Signed)
Patient reports he called a ride.

## 2018-07-17 NOTE — ED Notes (Signed)
Patient made aware urine sample is needed. Urinal provided at bedside. 
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
## 2018-07-17 NOTE — ED Notes (Signed)
Seizure pads applied to bed.  

## 2018-07-17 NOTE — ED Notes (Signed)
Patient cooperative at this time. Patient placed on cardiac monitoring, IV inserted and blood drawn.

## 2018-07-17 NOTE — ED Notes (Signed)
Patient transported to CT scan . 

## 2018-07-17 NOTE — ED Notes (Addendum)
Patient standing in corner by computer upon my entering. This Clinical research associate asked the patient what he was doing? Patient replied, "I'm trying to cool off. I'm hot." This writer explained that "we need to get patient on monitor so we can evaluate him." Patient asked for a bucket of ice so he can cool down

## 2018-12-23 ENCOUNTER — Other Ambulatory Visit: Payer: Self-pay

## 2018-12-23 ENCOUNTER — Emergency Department (HOSPITAL_COMMUNITY): Payer: No Typology Code available for payment source

## 2018-12-23 ENCOUNTER — Emergency Department (HOSPITAL_COMMUNITY)
Admission: EM | Admit: 2018-12-23 | Discharge: 2018-12-23 | Disposition: A | Discharge status: Home / Self Care | Payer: No Typology Code available for payment source | Attending: Emergency Medicine | Admitting: Emergency Medicine

## 2018-12-23 ENCOUNTER — Encounter (HOSPITAL_COMMUNITY): Payer: Self-pay | Admitting: Emergency Medicine

## 2018-12-23 DIAGNOSIS — S52001A Unspecified fracture of upper end of right ulna, initial encounter for closed fracture: Secondary | ICD-10-CM

## 2018-12-23 DIAGNOSIS — Y999 Unspecified external cause status: Secondary | ICD-10-CM | POA: Diagnosis not present

## 2018-12-23 DIAGNOSIS — F17210 Nicotine dependence, cigarettes, uncomplicated: Secondary | ICD-10-CM | POA: Diagnosis not present

## 2018-12-23 DIAGNOSIS — Y92410 Unspecified street and highway as the place of occurrence of the external cause: Secondary | ICD-10-CM | POA: Diagnosis not present

## 2018-12-23 DIAGNOSIS — Z0279 Encounter for issue of other medical certificate: Secondary | ICD-10-CM | POA: Diagnosis not present

## 2018-12-23 DIAGNOSIS — Y93I9 Activity, other involving external motion: Secondary | ICD-10-CM | POA: Diagnosis not present

## 2018-12-23 DIAGNOSIS — S52091A Other fracture of upper end of right ulna, initial encounter for closed fracture: Secondary | ICD-10-CM | POA: Diagnosis present

## 2018-12-23 DIAGNOSIS — Z4789 Encounter for other orthopedic aftercare: Secondary | ICD-10-CM | POA: Insufficient documentation

## 2018-12-23 MED ORDER — TRAMADOL HCL 50 MG PO TABS: 50.0000 mg | ORAL_TABLET | Freq: Four times a day (QID) | ORAL | 0 refills | Status: DC | PRN

## 2018-12-23 NOTE — Discharge Instructions (Addendum)
Follow-up with the orthopedist provided.  Return here as needed °
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
## 2018-12-23 NOTE — ED Triage Notes (Signed)
Patient here from home requesting re-wrapping of right arm cast. Previous fracture. States that he also needs a work note.

## 2019-05-24 ENCOUNTER — Emergency Department (HOSPITAL_COMMUNITY): Payer: Self-pay

## 2019-05-24 ENCOUNTER — Other Ambulatory Visit: Payer: Self-pay

## 2019-05-24 ENCOUNTER — Emergency Department (HOSPITAL_COMMUNITY)
Admission: EM | Admit: 2019-05-24 | Discharge: 2019-05-24 | Disposition: A | Discharge status: Home / Self Care | Payer: Self-pay | Attending: Emergency Medicine | Admitting: Emergency Medicine

## 2019-05-24 ENCOUNTER — Encounter (HOSPITAL_COMMUNITY): Payer: Self-pay | Admitting: Emergency Medicine

## 2019-05-24 DIAGNOSIS — Z23 Encounter for immunization: Secondary | ICD-10-CM | POA: Diagnosis not present

## 2019-05-24 DIAGNOSIS — Y939 Activity, unspecified: Secondary | ICD-10-CM | POA: Diagnosis not present

## 2019-05-24 DIAGNOSIS — Y999 Unspecified external cause status: Secondary | ICD-10-CM | POA: Insufficient documentation

## 2019-05-24 DIAGNOSIS — W540XXA Bitten by dog, initial encounter: Secondary | ICD-10-CM | POA: Insufficient documentation

## 2019-05-24 DIAGNOSIS — F17210 Nicotine dependence, cigarettes, uncomplicated: Secondary | ICD-10-CM | POA: Diagnosis not present

## 2019-05-24 DIAGNOSIS — Y929 Unspecified place or not applicable: Secondary | ICD-10-CM | POA: Diagnosis not present

## 2019-05-24 DIAGNOSIS — S61531A Puncture wound without foreign body of right wrist, initial encounter: Secondary | ICD-10-CM | POA: Insufficient documentation

## 2019-05-24 LAB — RAPID HIV SCREEN (HIV 1/2 AB+AG)
HIV 1/2 Antibodies: NONREACTIVE
HIV-1 P24 Antigen - HIV24: NONREACTIVE

## 2019-05-24 LAB — RPR: RPR Ser Ql: NONREACTIVE

## 2019-05-24 MED ORDER — TETANUS-DIPHTH-ACELL PERTUSSIS 5-2.5-18.5 LF-MCG/0.5 IM SUSP
0.5000 mL | Freq: Once | INTRAMUSCULAR | Status: AC
  Administered 2019-05-24: 08:00:00 0.5 mL via INTRAMUSCULAR
  Filled 2019-05-24: qty 0.5

## 2019-05-24 MED ORDER — AMOXICILLIN-POT CLAVULANATE 875-125 MG PO TABS: 1.0000 | ORAL_TABLET | Freq: Two times a day (BID) | ORAL | 0 refills | Status: DC

## 2019-05-24 NOTE — ED Provider Notes (Signed)
[facility] EMERGENCY DEPARTMENT Provider Note   CSN: [id number] Arrival date & time: 05/24/19  0149     History Chief Complaint  Patient presents with  . Animal Bite    Dog Bite    [facility] is a 39 y.o. male.  The history is provided by the patient.  Animal Bite Contact animal:  Dog Location:  Shoulder/arm Shoulder/arm injury location:  R forearm Pain details:    Quality:  Aching   Severity:  No pain   Timing:  Constant   Progression:  Unchanged Incident location:  Home Provoked: unprovoked   Animal in possession: yes   Relieved by:  Nothing Worsened by:  Nothing Associated symptoms: no fever, no numbness and no rash        Past Medical History:  Diagnosis Date  . Bipolar 1 disorder ([facility])   . Depression   . Schizophrenia [facility])     Patient Active Problem List   Diagnosis Date Noted  . Open fracture of left distal radius 06/11/2016    Past Surgical History:  Procedure Laterality Date  . OPEN REDUCTION INTERNAL FIXATION (ORIF) DISTAL RADIAL FRACTURE Left 06/11/2016   Procedure: IRRIGATION AND DEBRIDEMENT WITH OPEN REDUCTION INTERNAL FIXATION (ORIF) DISTAL RADIAL FRACTURE AND TENDON REPAIR;  Surgeon: Betha Loa, MD;  Location: [facility] OR;  Service: Orthopedics;  Laterality: Left;       No family history on file.  Social History   Tobacco Use  . Smoking status: Current Every Day Smoker  . Smokeless tobacco: Current User  Substance Use Topics  . Alcohol use: Yes  . Drug use: Yes    Frequency: 2.0 times per week    Types: Cocaine, Marijuana    Home Medications Prior to Admission medications   Medication Sig Start Date End Date Taking? Authorizing Provider  amoxicillin-clavulanate (AUGMENTIN) 875-125 MG tablet Take 1 tablet by mouth 2 (two) times daily for 10 days. 05/24/19 06/03/19  Alver Leete, DO  cephALEXin (KEFLEX) 500 MG capsule Take 1 capsule (500 mg total) by mouth 4 (four) times daily. 08/01/16   Rolland Porter, MD    HYDROcodone-acetaminophen (NORCO/VICODIN) 5-325 MG tablet Take 1 tablet by mouth every 4 (four) hours as needed. 08/01/16   Rolland Porter, MD  naproxen (NAPROSYN) 500 MG tablet Take 1 tablet (500 mg total) by mouth 2 (two) times daily. 01/11/18   Renne Crigler, PA-C  oxyCODONE-acetaminophen (PERCOCET) 5-325 MG tablet 1-2 tabs PO q6 hours prn pain 06/11/16   Betha Loa, MD  sulfamethoxazole-trimethoprim (BACTRIM DS) 800-160 MG tablet Take 1 tablet by mouth 2 (two) times daily. 06/11/16   Betha Loa, MD  traMADol (ULTRAM) 50 MG tablet Take 1 tablet (50 mg total) by mouth every 6 (six) hours as needed for severe pain. 12/23/18   Lawyer, Cristal Deer, PA-C    Allergies    Patient has no known allergies.  Review of Systems   Review of Systems  Constitutional: Negative for fever.  Musculoskeletal: Negative for arthralgias.  Skin: Positive for wound. Negative for color change, pallor and rash.  Neurological: Negative for weakness and numbness.    Physical Exam Updated Vital Signs  ED Triage Vitals  Enc Vitals Group     BP 05/24/19 0153 (!) 146/92     Pulse Rate 05/24/19 0153 97     Resp 05/24/19 0153 17     Temp 05/24/19 0153 97.9 F (36.6 C)     Temp src --      SpO2 05/24/19 0153 100 %  Weight 05/24/19 0159 187 lb 6.3 oz (85 kg)     Height 05/24/19 0159 5\' 7"  (1.702 m)     Head Circumference --      Peak Flow --      Pain Score 05/24/19 0159 7     Pain Loc --      Pain Edu? --      Excl. in [place]? --     Physical Exam Constitutional:      General: He is not in acute distress.    Appearance: He is not ill-appearing.  HENT:     Head: Normocephalic and atraumatic.  Cardiovascular:     Pulses: Normal pulses.  Skin:    General: Skin is warm.     Comments: Two puncture wounds to right wrist, mild swelling but no drainage  Neurological:     Mental Status: He is alert.     Sensory: No sensory deficit.     Motor: No weakness.     ED Results / Procedures / Treatments    Labs (all labs ordered are listed, but only abnormal results are displayed) Labs Reviewed  RAPID HIV SCREEN (HIV 1/2 AB+AG)  RPR    EKG None  Radiology DG Wrist Complete Right  Result Date: 05/24/2019 CLINICAL DATA:  Initial evaluation for acute animal bite, swelling. EXAM: RIGHT WRIST - COMPLETE 3+ VIEW COMPARISON:  None. FINDINGS: There is no evidence of fracture or dislocation. There is no evidence of arthropathy or other focal bone abnormality. Mild soft tissue swelling and irregularity at the volar aspect of the right wrist. No radiopaque foreign body. IMPRESSION: 1. Mild soft tissue swelling at the volar aspect of the right wrist, presumably related to history of animal bite. No radiopaque foreign body. 2. No acute osseous abnormality. Electronically Signed   By: Jeannine Boga M.D.   On: 05/24/2019 02:29    Procedures Procedures (including critical care time)  Medications Ordered in ED Medications  Tdap (BOOSTRIX) injection 0.5 mL (has no administration in time range)    ED Course  I have reviewed the triage vital signs and the nursing notes.  Pertinent labs & imaging results that were available during my care of the patient were reviewed by me and considered in my medical decision making (see chart for details).    MDM Rules/Calculators/A&P                      [facility] is a 39 year old male with history of psych issues who presents to the ED with dog bite to right wrist.  Patient states that he was bit by his dog that was provoked.  States that he thinks that his dog has a shots.  Does not want dog quarantined.  Does not want rabies vaccination after extensive conversation about it.  He states that while he is here he does want syphilis testing and HIV testing but does not have any symptoms.  Wound was washed and bandaged.  Patient given prescription for Augmentin.  Told that if you change his mind about wanting to have his animal monitor that he should  contact animal control, refused to give [place] information about his dog.  Understands to return if dog exhibits any strange behavior.  Discharged in good condition.  This chart was dictated using voice recognition software.  Despite best efforts to proofread,  errors can occur which can change the documentation meaning.     Final Clinical Impression(s) / ED Diagnoses Final diagnoses:  Dog bite, initial encounter    Rx / DC Orders ED Discharge Orders         Ordered    amoxicillin-clavulanate (AUGMENTIN) 875-125 MG tablet  2 times daily     05/24/19 0734           Virgina Norfolk, DO 05/24/19 [phone]

## 2019-05-24 NOTE — ED Triage Notes (Signed)
Patient bitten by a stray dog at right wrist this morning, presents with puncture wounds at right wrist with minimal bleeding and mild swelling .

## 2019-05-24 NOTE — ED Notes (Signed)
Pt reports he was trying to stop his dog from getting out and was bitten.  Pt is not sure if his dog has had its shots.  Pt is alert and oriented, bleeding is controlled.

## 2019-05-24 NOTE — Discharge Instructions (Addendum)
If you change your mind about having her dog monitored for rabies or if dog seems to be exhibiting any strange behavior please return to the ED for rabies vaccination series as discussed.

## 2019-06-01 ENCOUNTER — Other Ambulatory Visit: Payer: Self-pay

## 2019-06-01 ENCOUNTER — Encounter (HOSPITAL_COMMUNITY): Payer: Self-pay

## 2019-06-01 ENCOUNTER — Emergency Department (HOSPITAL_COMMUNITY)
Admission: EM | Admit: 2019-06-01 | Discharge: 2019-06-02 | Disposition: A | Discharge status: Home / Self Care | Payer: Self-pay | Attending: Emergency Medicine | Admitting: Emergency Medicine

## 2019-06-01 DIAGNOSIS — L02419 Cutaneous abscess of limb, unspecified: Secondary | ICD-10-CM

## 2019-06-01 DIAGNOSIS — L02413 Cutaneous abscess of right upper limb: Secondary | ICD-10-CM | POA: Insufficient documentation

## 2019-06-01 DIAGNOSIS — W540XXD Bitten by dog, subsequent encounter: Secondary | ICD-10-CM | POA: Insufficient documentation

## 2019-06-01 DIAGNOSIS — Z79899 Other long term (current) drug therapy: Secondary | ICD-10-CM | POA: Insufficient documentation

## 2019-06-01 DIAGNOSIS — F17210 Nicotine dependence, cigarettes, uncomplicated: Secondary | ICD-10-CM | POA: Insufficient documentation

## 2019-06-01 DIAGNOSIS — I10 Essential (primary) hypertension: Secondary | ICD-10-CM | POA: Insufficient documentation

## 2019-06-01 HISTORY — DX: Essential (primary) hypertension: I10

## 2019-06-01 LAB — CBC WITH DIFFERENTIAL/PLATELET
Abs Immature Granulocytes: 0.02 10*3/uL (ref 0.00–0.07)
Basophils Absolute: 0 10*3/uL (ref 0.0–0.1)
Basophils Relative: 0 %
Eosinophils Absolute: 0 10*3/uL (ref 0.0–0.5)
Eosinophils Relative: 1 %
HCT: 44 % (ref 39.0–52.0)
Hemoglobin: 13.9 g/dL (ref 13.0–17.0)
Immature Granulocytes: 0 %
Lymphocytes Relative: 24 %
Lymphs Abs: 1.7 10*3/uL (ref 0.7–4.0)
MCH: 25.5 pg — ABNORMAL LOW (ref 26.0–34.0)
MCHC: 31.6 g/dL (ref 30.0–36.0)
MCV: 80.7 fL (ref 80.0–100.0)
Monocytes Absolute: 0.7 10*3/uL (ref 0.1–1.0)
Monocytes Relative: 9 %
Neutro Abs: 4.5 10*3/uL (ref 1.7–7.7)
Neutrophils Relative %: 66 %
Platelets: 364 10*3/uL (ref 150–400)
RBC: 5.45 MIL/uL (ref 4.22–5.81)
RDW: 13.2 % (ref 11.5–15.5)
WBC: 6.9 10*3/uL (ref 4.0–10.5)
nRBC: 0 % (ref 0.0–0.2)

## 2019-06-01 LAB — BASIC METABOLIC PANEL
Anion gap: 9 (ref 5–15)
BUN: 10 mg/dL (ref 6–20)
CO2: 25 mmol/L (ref 22–32)
Calcium: 9.7 mg/dL (ref 8.9–10.3)
Chloride: 103 mmol/L (ref 98–111)
Creatinine, Ser: 0.79 mg/dL (ref 0.61–1.24)
GFR calc Af Amer: >60 mL/min (ref >60)
GFR calc non Af Amer: >60 mL/min (ref >60)
Glucose, Bld: 96 mg/dL (ref 70–99)
Potassium: 4 mmol/L (ref 3.5–5.1)
Sodium: 137 mmol/L (ref 135–145)

## 2019-06-01 MED ORDER — AMOXICILLIN-POT CLAVULANATE 875-125 MG PO TABS
1.0000 | ORAL_TABLET | Freq: Once | ORAL | Status: AC
  Administered 2019-06-01: 22:00:00 1 via ORAL
  Filled 2019-06-01: qty 1

## 2019-06-01 NOTE — ED Triage Notes (Addendum)
Pt arrives to ED w/ c/o scabies x 3 years. Pt presents w/ small wounds all over body. Pt has psych history.
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
## 2019-06-01 NOTE — ED Notes (Signed)
Case management called and left voicemail to call PA Harris per her request

## 2019-06-02 ENCOUNTER — Emergency Department (HOSPITAL_COMMUNITY): Payer: Self-pay

## 2019-06-02 ENCOUNTER — Telehealth: Payer: Self-pay | Admitting: *Deleted

## 2019-06-02 ENCOUNTER — Encounter (HOSPITAL_COMMUNITY): Payer: Self-pay | Admitting: Radiology

## 2019-06-02 MED ORDER — LIDOCAINE HCL (PF) 1 % IJ SOLN
10.0000 mL | Freq: Once | INTRAMUSCULAR | Status: AC
  Administered 2019-06-02: 03:00:00 10 mL via INTRADERMAL
  Filled 2019-06-02: qty 10

## 2019-06-02 MED ORDER — PERMETHRIN 5 % EX CREA: TOPICAL_CREAM | CUTANEOUS | 0 refills | Status: DC

## 2019-06-02 MED ORDER — ACETAMINOPHEN 500 MG PO TABS
1000.0000 mg | ORAL_TABLET | Freq: Once | ORAL | Status: AC
  Administered 2019-06-02: 02:00:00 1000 mg via ORAL
  Filled 2019-06-02: qty 2

## 2019-06-02 MED ORDER — IOHEXOL 300 MG/ML  SOLN
100.0000 mL | Freq: Once | INTRAMUSCULAR | Status: AC | PRN
  Administered 2019-06-02: 100 mL via INTRAVENOUS

## 2019-06-02 MED ORDER — AMOXICILLIN-POT CLAVULANATE 875-125 MG PO TABS: 1.0000 | ORAL_TABLET | Freq: Two times a day (BID) | ORAL | 0 refills | Status: DC

## 2019-06-02 NOTE — Discharge Instructions (Signed)
Keep the wound clean and dry.  It is very important that you take the antibiotics.  I provided your prescription with the antibiotics that he can fill.  Additionally, we have case management to help you fill your antibiotics.  They should be contacting you.  It is very important a return emergency department 2 days to have a wound recheck.  Return sooner if you start having any worsening redness, swelling, fevers or any other worsening or concerning symptoms.

## 2019-06-02 NOTE — ED Provider Notes (Signed)
Care assumed from [place], PA-C at shift change with CT pending.   In brief, this patient is a 39 y.o. M who reports getting bitten by a dog on 05/24/19. He was prescribed antibiotics (Augmentin) but states he was never able to get them. He came in today primarily for concerns of scabies but also says the puncture wounds to his right arm have been hurting. No fevers.  Please see note from previous provider for full history/physical exam.      Physical Exam  BP 114/62 (BP Location: Right Arm)   Pulse 70   Temp 99.8 F (37.7 C) (Oral)   Resp 17   Ht 5\' 4"  (1.626 m)   Wt 68 kg   SpO2 100%   BMI 25.75 kg/m   Physical Exam  2+ radial pulse noted to RUE.  He has 2 small puncture wounds noted to the anterior aspect of the right forearm that is about 3 inches proximal to the wrist.  There is some slight surrounding warmth, erythema.  One wound is actively draining purulent drainage. Flexion/tension of right wrist intact any difficulty.  He can fully flex and extend all 5 digits without any difficulty.  No bony tenderness noted to palpation.   ED Course/Procedures     .Marland KitchenIncision and Drainage  Date/Time: 06/02/2019 2:37 AM Performed by: Volanda Napoleon, PA-C Authorized by: Volanda Napoleon, PA-C   Consent:    Consent obtained:  Verbal   Consent given by:  Patient   Risks discussed:  Bleeding, incomplete drainage, pain and damage to other organs   Alternatives discussed:  No treatment Universal protocol:    Procedure explained and questions answered to patient or proxy's satisfaction: yes     Relevant documents present and verified: yes     Test results available and properly labeled: yes     Imaging studies available: yes     Required blood products, implants, devices, and special equipment available: yes     Site/side marked: yes     Immediately prior to procedure a time out was called: yes     Patient identity confirmed:  Verbally with patient Location:    Type:   Abscess   Location:  Upper extremity   Upper extremity location:  Arm   Arm location:  R lower arm Pre-procedure details:    Skin preparation:  Betadine Anesthesia (see MAR for exact dosages):    Anesthesia method:  Local infiltration   Local anesthetic:  Lidocaine 1% w/o epi Procedure type:    Complexity:  Complex Procedure details:    Incision types:  Single straight   Incision depth:  Subcutaneous   Scalpel blade:  11   Wound management:  Probed and deloculated, irrigated with saline and extensive cleaning   Drainage:  Purulent   Drainage amount:  Moderate Post-procedure details:    Patient tolerance of procedure:  Tolerated well, no immediate complications     Results for orders placed or performed during the hospital encounter of 06/01/19 (from the past 24 hour(s))  CBC with Differential     Status: Abnormal   Collection Time: 06/01/19 10:48 PM  Result Value Ref Range   WBC 6.9 4.0 - 10.5 K/uL   RBC 5.45 4.22 - 5.81 MIL/uL   Hemoglobin 13.9 13.0 - 17.0 g/dL   HCT 44.0 39.0 - 52.0 %   MCV 80.7 80.0 - 100.0 fL   MCH 25.5 (L) 26.0 - 34.0 pg   MCHC 31.6 30.0 - 36.0 g/dL  RDW 13.2 11.5 - 15.5 %   Platelets 364 150 - 400 K/uL   nRBC 0.0 0.0 - 0.2 %   Neutrophils Relative % 66 %   Neutro Abs 4.5 1.7 - 7.7 K/uL   Lymphocytes Relative 24 %   Lymphs Abs 1.7 0.7 - 4.0 K/uL   Monocytes Relative 9 %   Monocytes Absolute 0.7 0.1 - 1.0 K/uL   Eosinophils Relative 1 %   Eosinophils Absolute 0.0 0.0 - 0.5 K/uL   Basophils Relative 0 %   Basophils Absolute 0.0 0.0 - 0.1 K/uL   Immature Granulocytes 0 %   Abs Immature Granulocytes 0.02 0.00 - 0.07 K/uL  Basic metabolic panel     Status: None   Collection Time: 06/01/19 10:48 PM  Result Value Ref Range   Sodium 137 135 - 145 mmol/L   Potassium 4.0 3.5 - 5.1 mmol/L   Chloride 103 98 - 111 mmol/L   CO2 25 22 - 32 mmol/L   Glucose, Bld 96 70 - 99 mg/dL   BUN 10 6 - 20 mg/dL   Creatinine, Ser 2.63 0.61 - 1.24 mg/dL   Calcium  9.7 8.9 - 78.5 mg/dL   GFR calc non Af Amer >60 >60 mL/min   GFR calc Af Amer >60 >60 mL/min   Anion gap 9 5 - 15    MDM    PLAN: Patient pending CT scan for evaluation of possible infection.  Plan to follow-up  MDM:  CT forearm shows focal fluid collection in the anterior distal forearm soft tissues that is 2 x 2 cm compatible with soft tissue abscess.  This is not that extend into the muscle and and does not have any evidence of osteomyelitis.  Discussed results with patient.  At this time, patient is afebrile without any leukocytosis.  I discussed with him regarding abscess and recommended ID.  Patient is agreeable.  I&D as documented above.  Patient tolerated procedure well.  The wounds were extensively and thoroughly irrigated with over a liter of sterile saline.  Patient is given Augmentin here in the ED.  We will plan to send him home with a prescription for Augmentin.  I encouraged him strongly to fill it up.  Additionally, patient is requesting something for scabies.  He does have some slight excoriations noted on his hands though I do not see any evidence of burrows.  He states he has history of scabies before.  We will plan for permethrin cream.  Additionally, case management has been consulted to help him acquire his medications.  I instructed patient he needs to return in 2 days to have wound recheck. At this time, patient exhibits no emergent life-threatening condition that require further evaluation in ED or admission. Patient had ample opportunity for questions and discussion. All patient's questions were answered with full understanding. Strict return precautions discussed. Patient expresses understanding and agreement to plan.    1. Dog bite, subsequent encounter   2. Abscess of forearm     Portions of this note were generated with Dragon dictation software. Dictation errors may occur despite best attempts at proofreading.     Maxwell Caul, PA-C 06/02/19 0336     Dione Booze, MD 06/02/19 [phone]

## 2019-06-02 NOTE — Telephone Encounter (Signed)
  [facility] Medication Assistance Card Name: Niyam Bisping ID (MRN): [id number] Bin: [id number] RX Group: [id number] Discharge Date: 06/02/2019 Expiration Date:06/10/2019                                           (must be filled within 7 days of discharge)     You have been approved to have the prescriptions written by your discharging physician filled through our [facility] (Medication Assistance Through [facility]) program. This program allows for a one-time (no refills) 34-day supply of selected medications for a low copay amount.  The copay is $3.00 per prescription. For instance, if you have one prescription, you will pay $3.00; for two prescriptions, you pay $6.00; for three prescriptions, you pay $9.00; and so on.  Only certain pharmacies are participating in this program with [facility]. You will need to select one of the pharmacies from the attached list and take your prescriptions, this letter, and your photo ID to one of the participating pharmacies.   We are excited that you are able to use the [facility] program to get your medications. These prescriptions must be filled within 7 days of hospital discharge or they will no longer be valid for the [facility] program. Should you have any problems with your prescriptions please contact your case management team member at [phone] for [place]/ [facility].  Thank you, [facility]

## 2019-08-21 ENCOUNTER — Encounter (HOSPITAL_COMMUNITY): Payer: Self-pay

## 2019-08-21 ENCOUNTER — Emergency Department (HOSPITAL_COMMUNITY)
Admission: EM | Admit: 2019-08-21 | Discharge: 2019-08-21 | Disposition: A | Discharge status: Home / Self Care | Payer: Self-pay | Attending: Emergency Medicine | Admitting: Emergency Medicine

## 2019-08-21 ENCOUNTER — Other Ambulatory Visit: Payer: Self-pay

## 2019-08-21 ENCOUNTER — Ambulatory Visit (HOSPITAL_COMMUNITY)
Admission: EM | Admit: 2019-08-21 | Discharge: 2019-08-21 | Disposition: A | Discharge status: Other Acute Inpatient Hospital | Payer: Self-pay

## 2019-08-21 DIAGNOSIS — Y929 Unspecified place or not applicable: Secondary | ICD-10-CM | POA: Insufficient documentation

## 2019-08-21 DIAGNOSIS — Z5321 Procedure and treatment not carried out due to patient leaving prior to being seen by health care provider: Secondary | ICD-10-CM | POA: Insufficient documentation

## 2019-08-21 DIAGNOSIS — S0001XA Abrasion of scalp, initial encounter: Secondary | ICD-10-CM | POA: Insufficient documentation

## 2019-08-21 DIAGNOSIS — Y9389 Activity, other specified: Secondary | ICD-10-CM | POA: Insufficient documentation

## 2019-08-21 DIAGNOSIS — Y999 Unspecified external cause status: Secondary | ICD-10-CM | POA: Insufficient documentation
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
## 2019-08-21 NOTE — ED Notes (Signed)
Patient is being discharged from the Urgent Care and sent to the Emergency Department via POV . Per [place], [place], patient is in need of higher level of care due to further eval. Patient is aware and verbalizes understanding of plan of care.  Vitals:   08/21/19 1039  BP: 135/87  Pulse: 94  Resp: 16  Temp: 97.8 F (36.6 C)  SpO2: 99%
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
## 2020-03-03 ENCOUNTER — Ambulatory Visit (HOSPITAL_COMMUNITY)
Admission: EM | Admit: 2020-03-03 | Discharge: 2020-03-03 | Disposition: A | Discharge status: Home / Self Care | Payer: Self-pay

## 2020-03-04 ENCOUNTER — Encounter (HOSPITAL_COMMUNITY): Payer: Self-pay

## 2020-03-04 ENCOUNTER — Emergency Department (HOSPITAL_COMMUNITY)
Admission: EM | Admit: 2020-03-04 | Discharge: 2020-03-04 | Disposition: A | Discharge status: Home / Self Care | Payer: HRSA Program | Attending: Emergency Medicine | Admitting: Emergency Medicine

## 2020-03-04 ENCOUNTER — Other Ambulatory Visit: Payer: Self-pay

## 2020-03-04 DIAGNOSIS — U071 COVID-19: Secondary | ICD-10-CM | POA: Insufficient documentation

## 2020-03-04 DIAGNOSIS — F17200 Nicotine dependence, unspecified, uncomplicated: Secondary | ICD-10-CM | POA: Diagnosis not present

## 2020-03-04 DIAGNOSIS — I10 Essential (primary) hypertension: Secondary | ICD-10-CM | POA: Diagnosis not present

## 2020-03-04 DIAGNOSIS — Z5900 Homelessness unspecified: Secondary | ICD-10-CM | POA: Diagnosis not present

## 2020-03-04 DIAGNOSIS — R059 Cough, unspecified: Secondary | ICD-10-CM | POA: Diagnosis present

## 2020-03-04 LAB — POC SARS CORONAVIRUS 2 AG -  ED: SARS Coronavirus 2 Ag: NEGATIVE

## 2020-03-04 NOTE — Clinical Social Work Note (Signed)
TOC consulted due to pt being homeless and needing resources. CSW awaiting COVID results to either refer to covid hotel or to homeless shelter. CSW updated COVID resulted negative. CSW spoke with RN about providing pt with taxi voucher to pt if he is interested in going to shelter at  [facility] [address] CSW informed RN that this is pts only shelter option and if he is not willing to go he will have to return to previous location. TOC signing off.

## 2020-03-04 NOTE — ED Provider Notes (Signed)
[facility]-EMERGENCY DEPT Provider Note   CSN: [id number] Arrival date & time: 03/04/20  1629     History Chief Complaint  Patient presents with  . Cold Exposure    Bruce Benton is a 40 y.o. male.  HPI 40 year old male presents feeling cold.  History is limited as the patient only nods yes or no or holds up fingers to talk to me.  He does not speak, which he states is because he is so cold.  This started with the weather change a few days ago.  He is homeless.  Has not been able to get to the shelter because he is too cold to walk.  He does endorse a cough but no fevers over the last couple days.  He denies any acute pain.   Past Medical History:  Diagnosis Date  . Bipolar 1 disorder (HCC)   . Depression   . Hypertension   . Schizophrenia [facility])     Patient Active Problem List   Diagnosis Date Noted  . Open fracture of left distal radius 06/11/2016    Past Surgical History:  Procedure Laterality Date  . OPEN REDUCTION INTERNAL FIXATION (ORIF) DISTAL RADIAL FRACTURE Left 06/11/2016   Procedure: IRRIGATION AND DEBRIDEMENT WITH OPEN REDUCTION INTERNAL FIXATION (ORIF) DISTAL RADIAL FRACTURE AND TENDON REPAIR;  Surgeon: Betha Loa, MD;  Location: [facility] OR;  Service: Orthopedics;  Laterality: Left;       History reviewed. No pertinent family history.  Social History   Tobacco Use  . Smoking status: Current Every Day Smoker  . Smokeless tobacco: Current User  Substance Use Topics  . Alcohol use: Yes  . Drug use: Yes    Frequency: 2.0 times per week    Types: Cocaine, Marijuana    Home Medications Prior to Admission medications   Medication Sig Start Date End Date Taking? Authorizing Provider  amoxicillin-clavulanate (AUGMENTIN) 875-125 MG tablet Take 1 tablet by mouth every 12 (twelve) hours. 06/02/19   Maxwell Caul, PA-C  cephALEXin (KEFLEX) 500 MG capsule Take 1 capsule (500 mg total) by mouth 4 (four) times daily. 08/01/16   Rolland Porter,  MD  HYDROcodone-acetaminophen (NORCO/VICODIN) 5-325 MG tablet Take 1 tablet by mouth every 4 (four) hours as needed. 08/01/16   Rolland Porter, MD  naproxen (NAPROSYN) 500 MG tablet Take 1 tablet (500 mg total) by mouth 2 (two) times daily. 01/11/18   Renne Crigler, PA-C  oxyCODONE-acetaminophen (PERCOCET) 5-325 MG tablet 1-2 tabs PO q6 hours prn pain 06/11/16   Betha Loa, MD  permethrin (ELIMITE) 5 % cream Apply to affected area once 06/02/19   Graciella Freer A, PA-C  sulfamethoxazole-trimethoprim (BACTRIM DS) 800-160 MG tablet Take 1 tablet by mouth 2 (two) times daily. 06/11/16   Betha Loa, MD  traMADol (ULTRAM) 50 MG tablet Take 1 tablet (50 mg total) by mouth every 6 (six) hours as needed for severe pain. 12/23/18   Lawyer, Cristal Deer, PA-C    Allergies    Patient has no known allergies.  Review of Systems   Review of Systems  Unable to perform ROS: Other    Physical Exam Updated Vital Signs BP 122/78   Pulse 66   Temp 98.3 F (36.8 C) (Oral)   Resp 20   SpO2 98%   Physical Exam Vitals and nursing note reviewed.  Constitutional:      Appearance: He is well-developed and well-nourished. He is not ill-appearing or diaphoretic.  HENT:     Head: Normocephalic and atraumatic.  Right Ear: External ear normal.     Left Ear: External ear normal.     Nose: Nose normal.  Eyes:     General:        Right eye: No discharge.        Left eye: No discharge.  Cardiovascular:     Rate and Rhythm: Normal rate and regular rhythm.     Heart sounds: Normal heart sounds.  Pulmonary:     Effort: Pulmonary effort is normal.  Abdominal:     Palpations: Abdomen is soft.     Tenderness: There is no abdominal tenderness.  Musculoskeletal:        General: No edema.     Cervical back: Neck supple.  Skin:    General: Skin is warm and dry.  Neurological:     Mental Status: He is alert.  Psychiatric:        Mood and Affect: Mood is not anxious.     ED Results / Procedures /  Treatments   Labs (all labs ordered are listed, but only abnormal results are displayed) Labs Reviewed  SARS CORONAVIRUS 2 (TAT 6-24 HRS)  POC SARS CORONAVIRUS 2 AG -  ED    EKG None  Radiology No results found.  Procedures Procedures (including critical care time)  Medications Ordered in ED Medications - No data to display  ED Course  I have reviewed the triage vital signs and the nursing notes.  Pertinent labs & imaging results that were available during my care of the patient were reviewed by me and considered in my medical decision making (see chart for details).    MDM Rules/Calculators/A&P                          Patient has a chief complaint of feeling cold due to environmental exposure from being homeless.  His temperature is normal along with his other vitals.  He does endorse a cough so we will test for COVID-19.  Otherwise, social work was able to get him a taxi to a shelter. Final Clinical Impression(s) / ED Diagnoses Final diagnoses:  Homelessness    Rx / DC Orders ED Discharge Orders    None       Pricilla Loveless, MD 03/04/20 [phone]

## 2020-03-04 NOTE — ED Triage Notes (Addendum)
Pt arrived via EMS, states exposure to elements last night, and today. States he has covid, but has not been tested. States known covid exposure and recent cough.

## 2020-03-05 LAB — SARS CORONAVIRUS 2 (TAT 6-24 HRS): SARS Coronavirus 2: POSITIVE — AB
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
## 2020-03-09 ENCOUNTER — Encounter (HOSPITAL_COMMUNITY): Payer: Self-pay

## 2020-03-09 ENCOUNTER — Other Ambulatory Visit: Payer: Self-pay

## 2020-03-09 ENCOUNTER — Emergency Department (HOSPITAL_COMMUNITY)
Admission: EM | Admit: 2020-03-09 | Discharge: 2020-03-10 | Disposition: A | Discharge status: Home / Self Care | Payer: Self-pay | Attending: Emergency Medicine | Admitting: Emergency Medicine

## 2020-03-09 DIAGNOSIS — T7421XA Adult sexual abuse, confirmed, initial encounter: Secondary | ICD-10-CM | POA: Insufficient documentation

## 2020-03-09 DIAGNOSIS — Z5321 Procedure and treatment not carried out due to patient leaving prior to being seen by health care provider: Secondary | ICD-10-CM | POA: Insufficient documentation

## 2020-03-09 NOTE — ED Notes (Signed)
Pt called for triage, no answer
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
## 2020-03-10 NOTE — ED Notes (Signed)
No answer for room assignment

## 2020-06-24 ENCOUNTER — Emergency Department (HOSPITAL_COMMUNITY): Payer: Self-pay

## 2020-06-24 ENCOUNTER — Emergency Department (HOSPITAL_COMMUNITY)
Admission: EM | Admit: 2020-06-24 | Discharge: 2020-06-24 | Disposition: A | Discharge status: Home / Self Care | Payer: Self-pay | Attending: Emergency Medicine | Admitting: Emergency Medicine

## 2020-06-24 ENCOUNTER — Other Ambulatory Visit: Payer: Self-pay

## 2020-06-24 DIAGNOSIS — R1084 Generalized abdominal pain: Secondary | ICD-10-CM | POA: Insufficient documentation

## 2020-06-24 DIAGNOSIS — L905 Scar conditions and fibrosis of skin: Secondary | ICD-10-CM | POA: Insufficient documentation

## 2020-06-24 DIAGNOSIS — S60414A Abrasion of right ring finger, initial encounter: Secondary | ICD-10-CM | POA: Insufficient documentation

## 2020-06-24 DIAGNOSIS — Y906 Blood alcohol level of 120-199 mg/100 ml: Secondary | ICD-10-CM | POA: Insufficient documentation

## 2020-06-24 DIAGNOSIS — R55 Syncope and collapse: Secondary | ICD-10-CM | POA: Insufficient documentation

## 2020-06-24 DIAGNOSIS — S40211A Abrasion of right shoulder, initial encounter: Secondary | ICD-10-CM | POA: Insufficient documentation

## 2020-06-24 DIAGNOSIS — T1490XA Injury, unspecified, initial encounter: Secondary | ICD-10-CM

## 2020-06-24 DIAGNOSIS — R0789 Other chest pain: Secondary | ICD-10-CM | POA: Insufficient documentation

## 2020-06-24 DIAGNOSIS — M542 Cervicalgia: Secondary | ICD-10-CM | POA: Insufficient documentation

## 2020-06-24 DIAGNOSIS — S70312A Abrasion, left thigh, initial encounter: Secondary | ICD-10-CM | POA: Insufficient documentation

## 2020-06-24 DIAGNOSIS — F17200 Nicotine dependence, unspecified, uncomplicated: Secondary | ICD-10-CM | POA: Insufficient documentation

## 2020-06-24 DIAGNOSIS — S60412A Abrasion of right middle finger, initial encounter: Secondary | ICD-10-CM | POA: Insufficient documentation

## 2020-06-24 DIAGNOSIS — W540XXA Bitten by dog, initial encounter: Secondary | ICD-10-CM | POA: Insufficient documentation

## 2020-06-24 DIAGNOSIS — I10 Essential (primary) hypertension: Secondary | ICD-10-CM | POA: Insufficient documentation

## 2020-06-24 LAB — COMPREHENSIVE METABOLIC PANEL
ALT: 17 U/L (ref 0–44)
AST: 26 U/L (ref 15–41)
Albumin: 4.5 g/dL (ref 3.5–5.0)
Alkaline Phosphatase: 43 U/L (ref 38–126)
Anion gap: 11 (ref 5–15)
BUN: 17 mg/dL (ref 6–20)
CO2: 21 mmol/L — ABNORMAL LOW (ref 22–32)
Calcium: 9.4 mg/dL (ref 8.9–10.3)
Chloride: 106 mmol/L (ref 98–111)
Creatinine, Ser: 1.11 mg/dL (ref 0.61–1.24)
GFR, Estimated: >60 mL/min (ref >60)
Glucose, Bld: 90 mg/dL (ref 70–99)
Potassium: 4.1 mmol/L (ref 3.5–5.1)
Sodium: 138 mmol/L (ref 135–145)
Total Bilirubin: 0.4 mg/dL (ref 0.3–1.2)
Total Protein: 8.3 g/dL — ABNORMAL HIGH (ref 6.5–8.1)

## 2020-06-24 LAB — CK: Total CK: 457 U/L — ABNORMAL HIGH (ref 49–397)

## 2020-06-24 LAB — CBC WITH DIFFERENTIAL/PLATELET
Abs Immature Granulocytes: 0.04 10*3/uL (ref 0.00–0.07)
Basophils Absolute: 0 10*3/uL (ref 0.0–0.1)
Basophils Relative: 0 %
Eosinophils Absolute: 0.2 10*3/uL (ref 0.0–0.5)
Eosinophils Relative: 2 %
HCT: 44.7 % (ref 39.0–52.0)
Hemoglobin: 14.5 g/dL (ref 13.0–17.0)
Immature Granulocytes: 1 %
Lymphocytes Relative: 34 %
Lymphs Abs: 2.7 10*3/uL (ref 0.7–4.0)
MCH: 26 pg (ref 26.0–34.0)
MCHC: 32.4 g/dL (ref 30.0–36.0)
MCV: 80.3 fL (ref 80.0–100.0)
Monocytes Absolute: 0.4 10*3/uL (ref 0.1–1.0)
Monocytes Relative: 5 %
Neutro Abs: 4.7 10*3/uL (ref 1.7–7.7)
Neutrophils Relative %: 58 %
Platelets: 271 10*3/uL (ref 150–400)
RBC: 5.57 MIL/uL (ref 4.22–5.81)
RDW: 13.9 % (ref 11.5–15.5)
WBC: 8 10*3/uL (ref 4.0–10.5)
nRBC: 0 % (ref 0.0–0.2)

## 2020-06-24 LAB — ETHANOL: Alcohol, Ethyl (B): 185 mg/dL — ABNORMAL HIGH (ref <10)

## 2020-06-24 MED ORDER — LORAZEPAM 2 MG/ML IJ SOLN
1.0000 mg | Freq: Once | INTRAMUSCULAR | Status: AC
  Administered 2020-06-24: 1 mg via INTRAVENOUS
  Filled 2020-06-24: qty 1

## 2020-06-24 MED ORDER — KETOROLAC TROMETHAMINE 15 MG/ML IJ SOLN
15.0000 mg | Freq: Once | INTRAMUSCULAR | Status: AC
  Administered 2020-06-24: 15 mg via INTRAVENOUS
  Filled 2020-06-24: qty 1

## 2020-06-24 MED ORDER — SODIUM CHLORIDE 0.9 % IV BOLUS
1000.0000 mL | Freq: Once | INTRAVENOUS | Status: AC
  Administered 2020-06-24: 1000 mL via INTRAVENOUS

## 2020-06-24 MED ORDER — IOHEXOL 300 MG/ML  SOLN
75.0000 mL | Freq: Once | INTRAMUSCULAR | Status: AC | PRN
  Administered 2020-06-24: 75 mL via INTRAVENOUS

## 2020-06-24 MED ORDER — SODIUM CHLORIDE 0.9 % IV BOLUS: 1000.0000 mL | Freq: Once | INTRAVENOUS | Status: DC
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
## 2020-06-24 NOTE — ED Notes (Signed)
Attempted to ambulate, but was unsuccessful. Patient was lethargic and unable to move extremities.

## 2020-06-24 NOTE — ED Notes (Signed)
Provided pt with bagged lunch and drink.
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
